# Patient Record
Sex: Female | Born: 1995 | Marital: Single | Smoking: Never smoker
Health system: Southern US, Community
[De-identification: ages and names within clinical notes are randomized; demographics above are authoritative.]

## PROBLEM LIST (undated history)

## (undated) HISTORY — PX: LEG SURGERY: SHX1003

## (undated) HISTORY — PX: WISDOM TOOTH EXTRACTION: SHX21

---

## 2017-08-11 ENCOUNTER — Other Ambulatory Visit (HOSPITAL_COMMUNITY)
Admission: RE | Admit: 2017-08-11 | Discharge: 2017-08-11 | Disposition: A | Discharge status: Home / Self Care | Payer: Self-pay | Source: Ambulatory Visit | Attending: Nurse Practitioner | Admitting: Nurse Practitioner

## 2017-08-11 ENCOUNTER — Encounter: Payer: Self-pay | Admitting: Nurse Practitioner

## 2017-08-11 ENCOUNTER — Ambulatory Visit: Payer: Self-pay | Admitting: Nurse Practitioner

## 2017-08-11 VITALS — BP 130/60 | HR 77 | Temp 98.3°F | Ht <= 58 in | Wt 86.0 lb

## 2017-08-11 DIAGNOSIS — A5901 Trichomonal vulvovaginitis: Secondary | ICD-10-CM

## 2017-08-11 DIAGNOSIS — N761 Subacute and chronic vaginitis: Secondary | ICD-10-CM | POA: Insufficient documentation

## 2017-08-11 MED ORDER — METRONIDAZOLE 500 MG PO TABS: 1000.0000 mg | ORAL_TABLET | Freq: Two times a day (BID) | ORAL | 0 refills | Status: DC

## 2017-08-11 MED ORDER — METRONIDAZOLE 500 MG PO TABS: 2000.0000 mg | ORAL_TABLET | Freq: Two times a day (BID) | ORAL | 0 refills | Status: DC

## 2017-08-11 NOTE — Progress Notes (Addendum)
Subjective:  Patient ID: Andrea Harrison, female    DOB: 1996/01/30  Age: 22 y.o. MRN: [id number]  CC: New Patient (Initial Visit) (address vaginal symptom) and Vaginal Itching (She is C/O vaginal itching that started 6-7 months ago.  She got it checked out once and they said they thought it was a yeast inf and it came back.  Sometimes has yellowish color to discharge with slight odor. The last time she was sexually active was 7-8 months ago and did not use protection every time.)  Vaginal Itching  The patient's primary symptoms include genital itching, a genital odor and vaginal discharge. The patient's pertinent negatives include no genital lesions, genital rash, missed menses, pelvic pain or vaginal bleeding. This is a new problem. The current episode started more than 1 month ago. The problem occurs constantly. The problem has been unchanged. The patient is experiencing no pain. She is not pregnant. Pertinent negatives include no abdominal pain, back pain, chills, dysuria, fever, flank pain, joint pain, painful intercourse, rash or urgency. The vaginal discharge was white, thin and mucopurulent. The vaginal bleeding is typical of menses. The symptoms are aggravated by intercourse. She has tried antifungals for the symptoms. The treatment provided no relief. Sexual activity: not sexually active in last 8months. It is unknown whether or not her partner has an STD. She uses nothing for contraception. Her menstrual history has been regular. Her past medical history is significant for vaginosis.    No outpatient medications prior to visit.   No facility-administered medications prior to visit.     ROS See HPI  Objective:  BP 130/60 (BP Location: Left Arm, Patient Position: Sitting, Cuff Size: Small)   Pulse 77   Temp 98.3 F (36.8 C) (Oral)   Ht 3' 10.26" (1.175 m)   Wt 86 lb (39 kg)   LMP 08/04/2017   SpO2 97%   BMI 28.25 kg/m   BP Readings from Last 3 Encounters:  08/11/17 130/60    Wt  Readings from Last 3 Encounters:  08/11/17 86 lb (39 kg)    Physical Exam  Constitutional: No distress.  Cardiovascular: Normal rate.  Pulmonary/Chest: Effort normal.  Genitourinary: Rectal exam shows no external hemorrhoid. Pelvic exam was performed with patient supine. No labial fusion. There is no rash on the right labia. There is no rash or tenderness on the left labia. Cervix exhibits discharge and friability. Cervix exhibits no motion tenderness. Right adnexum displays no mass, no tenderness and no fullness. Left adnexum displays no mass, no tenderness and no fullness. Vaginal discharge found.  Lymphadenopathy: No inguinal adenopathy noted on the right or left side.  Skin: Skin is warm and dry. No rash noted.  Psychiatric: She has a normal mood and affect. Her behavior is normal.  Vitals reviewed.  No results found for: WBC, HGB, HCT, PLT, GLUCOSE, CHOL, TRIG, HDL, LDLDIRECT, LDLCALC, ALT, AST, NA, K, CL, CREATININE, BUN, CO2, TSH, PSA, INR, GLUF, HGBA1C, MICROALBUR  Assessment & Plan:   Andrea Harrison was seen today for new patient (initial visit) and vaginal itching.  Diagnoses and all orders for this visit:  Subacute vaginitis -     Cervicovaginal ancillary only -     Discontinue: metroNIDAZOLE (FLAGYL) 500 MG tablet; Take 4 tablets (2,000 mg total) by mouth 2 (two) times daily. -     Discontinue: metroNIDAZOLE (FLAGYL) 500 MG tablet; Take 2 tablets (1,000 mg total) by mouth 2 (two) times daily. -     fluconazole (DIFLUCAN) 150 MG tablet; Take  1 tablet (150 mg total) by mouth daily. Take second tab 3days apart from first tab -     metroNIDAZOLE (FLAGYL) 500 MG tablet; Take 2 tablets (1,000 mg total) by mouth 2 (two) times daily.  Trichomoniasis of vagina -     metroNIDAZOLE (FLAGYL) 500 MG tablet; Take 2 tablets (1,000 mg total) by mouth 2 (two) times daily.   I am having Andrea Harrison start on fluconazole. I am also having her maintain her metroNIDAZOLE.  Meds ordered this encounter   Medications  . DISCONTD: metroNIDAZOLE (FLAGYL) 500 MG tablet    Sig: Take 4 tablets (2,000 mg total) by mouth 2 (two) times daily.    Dispense:  4 tablet    Refill:  0    Order Specific Question:   Supervising Provider    Answer:   Dianne Dun [[id number]]  . DISCONTD: metroNIDAZOLE (FLAGYL) 500 MG tablet    Sig: Take 2 tablets (1,000 mg total) by mouth 2 (two) times daily.    Dispense:  4 tablet    Refill:  0    Change in dose    Order Specific Question:   Supervising Provider    Answer:   Dianne Dun [[id number]]  . fluconazole (DIFLUCAN) 150 MG tablet    Sig: Take 1 tablet (150 mg total) by mouth daily. Take second tab 3days apart from first tab    Dispense:  2 tablet    Refill:  0    Order Specific Question:   Supervising Provider    Answer:   Dianne Dun [[id number]]  . metroNIDAZOLE (FLAGYL) 500 MG tablet    Sig: Take 2 tablets (1,000 mg total) by mouth 2 (two) times daily.    Dispense:  4 tablet    Refill:  0    Change in dose    Order Specific Question:   Supervising Provider    Answer:   Dianne Dun [[id number]]    Follow-up: Return in about 1 month (around 09/08/2017) for CPE (fasting).  Alysia Penna, NP

## 2017-08-11 NOTE — Patient Instructions (Signed)
You will be called with lab results  Vaginitis Vaginitis is an inflammation of the vagina. It can happen when the normal bacteria and yeast in the vagina grow too much. There are different types. Treatment will depend on the type you have. Follow these instructions at home:  Take all medicines as told by your doctor.  Keep your vagina area clean and dry. Avoid soap. Rinse the area with water.  Avoid washing and cleaning out the vagina (douching).  Do not use tampons or have sex (intercourse) until your treatment is done.  Wipe from front to back after going to the restroom.  Wear cotton underwear.  Avoid wearing underwear while you sleep until your vaginitis is gone.  Avoid tight pants. Avoid underwear or nylons without a cotton panel.  Take off wet clothing (such as a bathing suit) as soon as you can.  Use mild, unscented products. Avoid fabric softeners and scented: ? Feminine sprays. ? Laundry detergents. ? Tampons. ? Soaps or bubble baths.  Practice safe sex and use condoms. Get help right away if:  You have belly (abdominal) pain.  You have a fever or lasting symptoms for more than 2-3 days.  You have a fever and your symptoms suddenly get worse. This information is not intended to replace advice given to you by your health care provider. Make sure you discuss any questions you have with your health care provider. Document Released: 06/24/2008 Document Revised: 09/03/2015 Document Reviewed: 09/08/2011 Elsevier Interactive Patient Education  2017 [employer].

## 2017-08-15 LAB — CERVICOVAGINAL ANCILLARY ONLY
Bacterial vaginitis: POSITIVE — AB
Candida vaginitis: POSITIVE — AB
Chlamydia: NEGATIVE
HPV (WINDOPATH): DETECTED — AB
Neisseria Gonorrhea: NEGATIVE
TRICH (WINDOWPATH): POSITIVE — AB

## 2017-08-15 MED ORDER — METRONIDAZOLE 500 MG PO TABS: 1000.0000 mg | ORAL_TABLET | Freq: Two times a day (BID) | ORAL | 0 refills | Status: DC

## 2017-08-15 MED ORDER — FLUCONAZOLE 150 MG PO TABS: 150.0000 mg | ORAL_TABLET | Freq: Every day | ORAL | 0 refills | Status: DC

## 2017-08-15 NOTE — Addendum Note (Signed)
Addended by: Alysia Penna L on: 08/15/2017 11:10 PM   Modules accepted: Orders

## 2017-08-24 ENCOUNTER — Encounter: Payer: Self-pay | Admitting: Nurse Practitioner

## 2017-08-24 ENCOUNTER — Other Ambulatory Visit (HOSPITAL_COMMUNITY)
Admission: RE | Admit: 2017-08-24 | Discharge: 2017-08-24 | Disposition: A | Discharge status: Home / Self Care | Payer: Self-pay | Source: Ambulatory Visit | Attending: Nurse Practitioner | Admitting: Nurse Practitioner

## 2017-08-24 ENCOUNTER — Ambulatory Visit: Payer: Self-pay | Admitting: Nurse Practitioner

## 2017-08-24 VITALS — BP 148/86 | HR 63 | Temp 97.6°F | Ht <= 58 in | Wt 85.6 lb

## 2017-08-24 DIAGNOSIS — B9689 Other specified bacterial agents as the cause of diseases classified elsewhere: Secondary | ICD-10-CM

## 2017-08-24 DIAGNOSIS — A5901 Trichomonal vulvovaginitis: Secondary | ICD-10-CM

## 2017-08-24 DIAGNOSIS — Z0001 Encounter for general adult medical examination with abnormal findings: Secondary | ICD-10-CM

## 2017-08-24 DIAGNOSIS — Z114 Encounter for screening for human immunodeficiency virus [HIV]: Secondary | ICD-10-CM

## 2017-08-24 DIAGNOSIS — N760 Acute vaginitis: Secondary | ICD-10-CM

## 2017-08-24 DIAGNOSIS — Q774 Achondroplasia: Secondary | ICD-10-CM

## 2017-08-24 LAB — COMPREHENSIVE METABOLIC PANEL
ALBUMIN: 4.3 g/dL (ref 3.5–5.2)
ALT: 37 U/L — ABNORMAL HIGH (ref 0–35)
AST: 33 U/L (ref 0–37)
Alkaline Phosphatase: 46 U/L (ref 39–117)
BUN: 11 mg/dL (ref 6–23)
CHLORIDE: 103 meq/L (ref 96–112)
CO2: 28 mEq/L (ref 19–32)
CREATININE: 0.46 mg/dL (ref 0.40–1.20)
Calcium: 9.7 mg/dL (ref 8.4–10.5)
GFR: 183.65 mL/min (ref >60.00)
GLUCOSE: 97 mg/dL (ref 70–99)
Potassium: 4.8 mEq/L (ref 3.5–5.1)
SODIUM: 140 meq/L (ref 135–145)
Total Bilirubin: 0.6 mg/dL (ref 0.2–1.2)
Total Protein: 7.1 g/dL (ref 6.0–8.3)

## 2017-08-24 LAB — CBC
HCT: 38.5 % (ref 36.0–46.0)
Hemoglobin: 13 g/dL (ref 12.0–15.0)
MCHC: 33.7 g/dL (ref 30.0–36.0)
MCV: 94.2 fl (ref 78.0–100.0)
PLATELETS: 265 10*3/uL (ref 150.0–400.0)
RBC: 4.09 Mil/uL (ref 3.87–5.11)
RDW: 12.4 % (ref 11.5–14.6)
WBC: 4.7 10*3/uL (ref 4.5–10.5)

## 2017-08-24 LAB — TSH: TSH: 1.43 u[IU]/mL (ref 0.35–5.50)

## 2017-08-24 NOTE — Progress Notes (Signed)
Subjective:    Patient ID: Andrea Harrison, female    DOB: 1996-01-05, 22 y.o.   MRN: [id number]  Patient presents today for complete physical  HPI   last pcp with [facility] while in middle school.  Accompanied by mother.  Has questions about HPV and vaccine. Improved vaginal discharge, discharge is nor clear to white (normal for her). Resolved vaginal itching and odor. Not currently sexually active.  Immunizations: (TDAP, Hep C screen, Pneumovax, Influenza, zoster)  Health Maintenance  Topic Date Due  . HIV Screening  06/03/2012  . Flu Shot  11/09/2017  . Tetanus Vaccine  11/04/2018   Diet:regular.  Weight:  Wt Readings from Last 3 Encounters:  08/24/17 85 lb 9.6 oz (38.8 kg)  08/11/17 86 lb (39 kg)    Exercise:none.  Fall Risk: Fall Risk  08/11/2017  Falls in the past year? No   Home Safety:home with parents.  Depression/Suicide: Depression screen [facility] 2/9 08/11/2017  Decreased Interest 0  Down, Depressed, Hopeless 0  PHQ - 2 Score 0   Vision:needed, will schedule  Dental:up to date. Every 6months.  Medications and allergies reviewed with patient and updated if appropriate.  Patient Active Problem List   Diagnosis Date Noted  . 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 fluconazole (DIFLUCAN) 150 MG tablet Take 1 tablet (150 mg total) by mouth daily. Take second tab 3days apart from first tab (Patient not taking: Reported on 08/24/2017) 2 tablet 0  . metroNIDAZOLE (FLAGYL) 500 MG tablet Take 2 tablets (1,000 mg total) by mouth 2 (two) times daily. (Patient not taking: Reported on 08/24/2017) 4 tablet 0   No current facility-administered medications on file prior to visit.     History reviewed. No pertinent past medical history.  Past Surgical History:  Procedure Laterality Date  . WISDOM TOOTH EXTRACTION      Social History   Socioeconomic History  . Marital  status: Single    Spouse name: Not on file  . Number of children: Not on file  . Years of education: Not on file  . Highest education level: Not on file  Occupational History  . Occupation: Consulting civil engineer  Social Needs  . Financial resource strain: Not on file  . Food insecurity:    Worry: Not on file    Inability: Not on file  . Transportation needs:    Medical: Not on file    Non-medical: Not on file  Tobacco Use  . Smoking status: Never Smoker  . Smokeless tobacco: Never Used  Substance and Sexual Activity  . Alcohol use: Yes    Comment: Occas  . Drug use: Never  . Sexual activity: Not Currently    Partners: Male  Lifestyle  . Physical activity:    Days per week: Not on file    Minutes per session: Not on file  . Stress: Not on file  Relationships  . Social connections:    Talks on phone: Not on file    Gets together: Not on file    Attends religious service: Not on file    Active member of club or organization: Not on file    Attends meetings of clubs or organizations: Not on file    Relationship status: Not on file  Other Topics Concern  . Not on file  Social History Narrative  . Not on file    History reviewed. No pertinent family history.  Review of Systems  Constitutional: Negative for fever, malaise/fatigue and weight loss.  HENT: Negative for congestion and sore throat.   Eyes:       Negative for visual changes  Respiratory: Negative for cough and shortness of breath.   Cardiovascular: Negative for chest pain, palpitations and leg swelling.  Gastrointestinal: Negative for blood in stool, constipation, diarrhea and heartburn.  Genitourinary: Negative for dysuria, frequency and urgency.  Musculoskeletal: Negative for falls, joint pain and myalgias.  Skin: Negative for rash.  Neurological: Negative for dizziness, sensory change and headaches.  Endo/Heme/Allergies: Does not bruise/bleed easily.  Psychiatric/Behavioral: Negative for depression, substance  abuse and suicidal ideas. The patient is not nervous/anxious.     Objective:   Vitals:   08/24/17 1104  BP: (!) 148/86  Pulse: 63  Temp: 97.6 F (36.4 C)  SpO2: 98%    Body mass index is 28.12 kg/m.   Physical Examination:  Physical Exam  Constitutional: She is oriented to person, place, and time. She appears well-nourished. No distress.  HENT:  Right Ear: External ear normal.  Left Ear: External ear normal.  Nose: Nose normal.  Mouth/Throat: Oropharynx is clear and moist.  Eyes: Pupils are equal, round, and reactive to light. Conjunctivae and EOM are normal.  Neck: Normal range of motion. Neck supple. No thyromegaly present.  Cardiovascular: Normal rate, regular rhythm, normal heart sounds and intact distal pulses.  Pulmonary/Chest: Effort normal and breath sounds normal. Right breast exhibits no inverted nipple, no mass, no nipple discharge, no skin change and no tenderness. Left breast exhibits no inverted nipple, no mass, no nipple discharge, no skin change and no tenderness. Breasts are symmetrical.  Abdominal: Soft. Bowel sounds are normal. There is no tenderness. Hernia confirmed negative in the right inguinal area and confirmed negative in the left inguinal area.  Genitourinary: Vagina normal and uterus normal. Pelvic exam was performed with patient supine. There is no rash or tenderness on the right labia. There is no rash or tenderness on the left labia. Cervix exhibits discharge. Cervix exhibits no friability. Right adnexum displays no tenderness and no fullness. Left adnexum displays no tenderness and no fullness.  Genitourinary Comments: Thin white vaginal discharge, no odor  Musculoskeletal: She exhibits no tenderness.  Lymphadenopathy:    She has no cervical adenopathy. No inguinal adenopathy noted on the right or left side.  Neurological: She is alert and oriented to person, place, and time.  Skin: Skin is warm and dry. No rash noted.  Psychiatric: She has a  normal mood and affect. Her behavior is normal. Thought content normal.  Vitals reviewed.   ASSESSMENT and PLAN:  [name] was seen today for annual exam.  Diagnoses and all orders for this visit:  Encounter for preventative adult health care exam with abnormal findings -     CBC -     TSH -     Comprehensive metabolic panel -     HIV antibody  Trichomoniasis of vagina -     Cervicovaginal ancillary only  Encounter for screening for HIV -     HIV antibody  BV (bacterial vaginosis) -     metroNIDAZOLE (METROGEL) 0.75 % vaginal gel; Place 1 Applicatorful vaginally at bedtime.  Achondroplasia syndrome   I spent explaining to Ms. Smucker and her mother the different HPV virus in relation to cervical cancer and genital wart. We also talked about HPV vaccine, importance of safe sex practices and/or abstinence. Ms. Dumm decided to think about HPV vaccine and  get back to me.  No problem-specific Assessment & Plan notes found for this encounter.     Follow up: Return if symptoms worsen or fail to improve.  Alysia Penna, NP

## 2017-08-24 NOTE — Patient Instructions (Addendum)
Call office for nurse visit if you decide to get HPV vaccine  Preventive Care for Andrea Harrison, Female The transition to life after high school as a young adult can be a stressful time with many changes. You may start seeing a primary care physician instead of a pediatrician. This is the time when your health care becomes your responsibility. Preventive care refers to lifestyle choices and visits with your health care provider that can promote health and wellness. What does preventive care include?  A yearly physical exam. This is also called an annual wellness visit.  Dental exams once or twice a year.  Routine eye exams. Ask your health care provider how often you should have your eyes checked.  Personal lifestyle choices, including: ? Daily care of your teeth and gums. ? Regular physical activity. ? Eating a healthy diet. ? Avoiding tobacco and drug use. ? Avoiding or limiting alcohol use. ? Practicing safe sex. ? Taking vitamin and mineral supplements as recommended by your health care provider. What happens during an annual wellness visit? Preventive care starts with a yearly visit to your primary care physician. The services and screenings done by your health care provider during your annual wellness visit will depend on your overall health, lifestyle risk factors, and family history of disease. Counseling Your health care provider may ask you questions about:  Past medical problems and your family's medical history.  Medicines or supplements you take.  Health insurance and access to health care.  Alcohol, tobacco, and drug use.  Your safety at home, work, or school.  Access to firearms.  Emotional well-being and how you cope with stress.  Relationship well-being.  Diet, exercise, and sleep habits.  Your sexual health and activity.  Your methods of birth control.  Your menstrual cycle.  Your pregnancy history.  Screening You may have the following tests or  measurements:  Height, weight, and BMI.  Blood pressure.  Lipid and cholesterol levels.  Tuberculosis skin test.  Skin exam.  Vision and hearing tests.  Screening test for hepatitis.  Screening tests for sexually transmitted diseases (STDs), if you are at risk.  BRCA-related cancer screening. This may be done if you have a family history of breast, ovarian, tubal, or peritoneal cancers.  Pelvic exam and Pap test. This may be done every 3 years starting at age 3.  Vaccines Your health care provider may recommend certain vaccines, such as:  Influenza vaccine. This is recommended every year.  Tetanus, diphtheria, and acellular pertussis (Tdap, Td) vaccine. You may need a Td booster every 10 years.  Varicella vaccine. You may need this if you have not been vaccinated.  HPV vaccine. If you are 61 or younger, you may need three doses over 6 months.  Measles, mumps, and rubella (MMR) vaccine. You may need at least one dose of MMR. You may also need a second dose.  Pneumococcal 13-valent conjugate (PCV13) vaccine. You may need this if you have certain conditions and were not previously vaccinated.  Pneumococcal polysaccharide (PPSV23) vaccine. You may need one or two doses if you smoke cigarettes or if you have certain conditions.  Meningococcal vaccine. One dose is recommended if you are age 106-21 years and a first-year college student living in a residence hall, or if you have one of several medical conditions. You may also need additional booster doses.  Hepatitis A vaccine. You may need this if you have certain conditions or if you travel or work in places where you may be  exposed to hepatitis A.  Hepatitis B vaccine. You may need this if you have certain conditions or if you travel or work in places where you may be exposed to hepatitis B.  Haemophilus influenzae type b (Hib) vaccine. You may need this if you have certain risk factors.  Talk to your health care provider  about which screenings and vaccines you need and how often you need them. What steps can I take to develop healthy behaviors?  Have regular preventive health care visits with your primary care physician and dentist.  Eat a healthy diet.  Drink enough fluid to keep your urine clear or pale yellow.  Stay active. Exercise at least 30 minutes 5 or more days of the week.  Use alcohol responsibly.  Maintain a healthy weight.  Do not use any products that contain nicotine, such as cigarettes, chewing tobacco, and e-cigarettes. If you need help quitting, ask your health care provider.  Do not use drugs.  Practice safe sex.  Use birth control (contraception) to prevent unwanted pregnancy. If you plan to become pregnant, see your health care provider for a pre-conception visit.  Find healthy ways to manage stress. How can I protect myself from injury? Injuries from violence or accidents are the leading cause of death among young adults and can often be prevented. Take these steps to help protect yourself:  Always wear your seat belt while driving or riding in a vehicle.  Do not drive if you have been drinking alcohol. Do not ride with someone who has been drinking.  Do not drive when you are tired or distracted. Do not text while driving.  Wear a helmet and other protective equipment during sports activities.  If you have firearms in your house, make sure you follow all gun safety procedures.  Seek help if you have been bullied, physically abused, or sexually abused.  Use the Internet responsibly to avoid dangers such as online bullying and online sexual predators.  What can I do to cope with stress? Young adults may face many new challenges that can be stressful, such as finding a job, going to college, moving away from home, managing money, being in a relationship, getting married, and having children. To manage stress:  Avoid known stressful situations when you can.  Exercise  regularly.  Find a stress-reducing activity that works best for you. Examples include meditation, yoga, listening to music, or reading.  Spend time in nature.  Keep a journal to write about your stress and how you respond.  Talk to your health care provider about stress. He or she may suggest counseling.  Spend time with supportive friends or family.  Do not cope with stress by: ? Drinking alcohol or using drugs. ? Smoking cigarettes. ? Eating.  Where can I get more information? Learn more about preventive care and healthy habits from:  [place] and Gynecologists: KaraokeExchange.nl  U.S. Probation officer Task Force: StageSync.si  National Adolescent and [place]: StrategicRoad.nl  American Academy of Pediatrics Bright Futures: https://brightfutures.MemberVerification.co.za  Society for Adolescent Health and Medicine: MoralBlog.co.za.aspx  PodExchange.nl: ToyLending.fr  This information is not intended to replace advice given to you by your health care provider. Make sure you discuss any questions you have with your health care provider. Document Released: 08/13/2015 Document Revised: 09/03/2015 Document Reviewed: 08/13/2015 Elsevier Interactive Patient Education  2018 [place].   Human Papillomavirus Human papillomavirus (HPV) is the most common sexually transmitted infection (STI). It easily spreads from person  to person (is highly contagious). HPV infections cause genital warts. Certain types of HPV may cause cancers, including cancer of the lower part of the uterus (cervix), vagina, outer female genital area (vulva), penis, anus, and rectum. HPV may also cause cancers of the oral cavity, such as the throat,  tongue, and tonsils. There are many types of HPV. It usually does not cause symptoms. However, sometimes there are wart-like lesions in the throat or warts in the genital area that you can see or feel. It is possible to be infected for long periods and pass HPV to others without knowing it. What are the causes? HPV is caused by a virus that spreads from person to person through sexual contact. This includes oral, vaginal, or anal sex. What increases the risk? The following factors may make you more likely to develop this condition:  Having unprotected oral, vaginal, or anal sex.  Having several sex partners.  Having a sex partner who has other sex partners.  Having or having had another STI.  Having a weak disease-fighting (immune) system.  Having damaged skin in the genital area.  What are the signs or symptoms? Most people who have HPV do not have any symptoms. If symptoms are present, they may include:  Wartlike lesions in the throat (from having oral sex).  Warts on the infected skin or mucous membranes.  Genital warts that may itch, burn, bleed, or be painful during sexual intercourse.  How is this diagnosed? If wartlike lesions are present in the throat or if genital warts are present, your health care provider can usually diagnose HPV with a physical exam. Genital warts are easily seen. In females, tests may be used to diagnose HPV, including:  A Pap test. A Pap test takes a sample of cells from your cervix to check for cancer and HPV infection.  An HPV test. This is similar to a Pap test and involves taking a sample of cells from your cervix.  Using a scope to view the cervix (colposcopy). This may be done if a pelvic exam or Pap test is abnormal. A sample of tissue may be removed for testing (biopsy) during the colposcopy.  Currently, there is no test to detect HPV in males. How is this treated? There is no treatment for the virus itself. However, there are treatments  for the health problems and symptoms HPV can cause. Your health care provider will monitor you closely after you are treated as HPV can come back and may need treatment again. Treatment for HPV may include:  Medicines, which may be injected or applied to genital warts in a cream, lotion, liquid or gel form.  Use of a probe to apply extreme cold (cryotherapy) to the genital warts.  Application of an intense beam of light (laser treatment) on the genital warts.  Use of a probe to apply extreme heat (electrocautery) on the genital warts.  Surgery to remove the genital warts.  Follow these instructions at home: Medicines  Take over-the-counter and prescription medicines only as told by your health care provider. This include creams for itching or irritation.  Do not treat genital warts with medicines used for treating hand warts. General instructions  Do not touch or scratch the warts.  Do not have sex while you are being treated.  Do not douche or use tampons during treatment (women).  Tell your sex partner about your infection. He or she may also need to be treated.  If you become pregnant, tell your health  care provider that you have HPV. Your health care provider will monitor you closely during pregnancy to make sure your baby is safe.  Keep all follow-up visits as told by your health care provider. This is important. How is this prevented?  Talk with your health care provider about getting the HPV vaccines. These vaccines prevent some HPV infections and cancers. The vaccines are recommended for males and females between the ages of 81 and 51. They will not work if you already have HPV, and they are not recommended for pregnant women.  After treatment, use condoms during sex to prevent future infections.  Have only one sex partner.  Have a sex partner who does not have other sex partners.  Get regular Pap tests as directed by your health care provider. Contact a health care  provider if:  The treated skin becomes red, swollen, or painful.  You have a fever.  You feel generally ill.  You feel lumps or pimples sticking out in and around your genital area.  You develop bleeding of the vagina or the treatment area.  You have painful sexual intercourse. Summary  Human papillomavirus (HPV) is the most common sexually transmitted infection (STI) and is highly contagious.  Most people carrying HPV do not have any symptoms.  HPV can be prevented with vaccination. The vaccine is recommended for males and females between the ages of 33 and 65.  There is no treatment for the virus itself. However, there are treatments for the health problems and symptoms HPV can cause. This information is not intended to replace advice given to you by your health care provider. Make sure you discuss any questions you have with your health care provider. Document Released: 06/18/2003 Document Revised: 03/06/2016 Document Reviewed: 03/06/2016 Elsevier Interactive Patient Education  2018 [employer].  Normal lab results Wet prep indicates persistent BV, resolved trichomoniasis. Sent metronidazole gel for BV. pending HIV.

## 2017-08-25 ENCOUNTER — Encounter: Payer: Self-pay | Admitting: Nurse Practitioner

## 2017-08-25 DIAGNOSIS — Q774 Achondroplasia: Secondary | ICD-10-CM | POA: Insufficient documentation

## 2017-08-25 LAB — CERVICOVAGINAL ANCILLARY ONLY
Bacterial vaginitis: POSITIVE — AB
Candida vaginitis: NEGATIVE
TRICH (WINDOWPATH): NEGATIVE

## 2017-08-25 LAB — HIV ANTIBODY (ROUTINE TESTING W REFLEX): HIV 1&2 Ab, 4th Generation: NONREACTIVE

## 2017-08-25 MED ORDER — METRONIDAZOLE 0.75 % VA GEL: 1.0000 | Freq: Every day | VAGINAL | 0 refills | Status: DC

## 2017-08-28 ENCOUNTER — Telehealth: Payer: Self-pay | Admitting: Nurse Practitioner

## 2017-08-28 NOTE — Telephone Encounter (Signed)
Copied from CRM [id number]. Topic: Quick Communication - See Telephone Encounter >> Aug 28, 2017 11:59 AM Arlyss Gandy, NT wrote: CRM for notification. See Telephone encounter for: 08/28/17. Pt would like a call back to discuss lab results.

## 2017-10-09 ENCOUNTER — Telehealth: Payer: Self-pay | Admitting: Family

## 2017-10-09 DIAGNOSIS — J069 Acute upper respiratory infection, unspecified: Secondary | ICD-10-CM

## 2017-10-09 DIAGNOSIS — B9789 Other viral agents as the cause of diseases classified elsewhere: Secondary | ICD-10-CM

## 2017-10-09 MED ORDER — BENZONATATE 100 MG PO CAPS: 100.0000 mg | ORAL_CAPSULE | Freq: Three times a day (TID) | ORAL | 0 refills | Status: DC | PRN

## 2017-10-09 MED ORDER — FLUTICASONE PROPIONATE 50 MCG/ACT NA SUSP
2.0000 | Freq: Every day | NASAL | 6 refills | Status: DC
Start: 2017-10-09 — End: 2017-12-14

## 2017-10-09 NOTE — Progress Notes (Signed)

## 2017-11-26 ENCOUNTER — Other Ambulatory Visit: Payer: Self-pay | Admitting: Nurse Practitioner

## 2017-11-26 DIAGNOSIS — B9689 Other specified bacterial agents as the cause of diseases classified elsewhere: Secondary | ICD-10-CM

## 2017-11-26 DIAGNOSIS — A5901 Trichomonal vulvovaginitis: Secondary | ICD-10-CM

## 2017-11-26 DIAGNOSIS — N760 Acute vaginitis: Secondary | ICD-10-CM

## 2017-11-26 DIAGNOSIS — N761 Subacute and chronic vaginitis: Secondary | ICD-10-CM

## 2017-11-27 MED ORDER — METRONIDAZOLE 0.75 % VA GEL: 1.0000 | Freq: Every day | VAGINAL | 0 refills | Status: DC

## 2017-11-27 NOTE — Telephone Encounter (Signed)
Ok to send in the gel form but denied tablet per [place].

## 2017-12-13 ENCOUNTER — Encounter: Payer: Self-pay | Admitting: Nurse Practitioner

## 2017-12-14 ENCOUNTER — Encounter: Payer: Self-pay | Admitting: Nurse Practitioner

## 2017-12-14 ENCOUNTER — Other Ambulatory Visit (HOSPITAL_COMMUNITY)
Admission: RE | Admit: 2017-12-14 | Discharge: 2017-12-14 | Disposition: A | Discharge status: Home / Self Care | Payer: Self-pay | Source: Ambulatory Visit | Attending: Nurse Practitioner | Admitting: Nurse Practitioner

## 2017-12-14 ENCOUNTER — Ambulatory Visit: Payer: Self-pay | Admitting: Nurse Practitioner

## 2017-12-14 VITALS — BP 120/64 | HR 69 | Temp 97.8°F | Ht <= 58 in | Wt 85.0 lb

## 2017-12-14 DIAGNOSIS — Z7251 High risk heterosexual behavior: Secondary | ICD-10-CM

## 2017-12-14 DIAGNOSIS — N898 Other specified noninflammatory disorders of vagina: Secondary | ICD-10-CM | POA: Insufficient documentation

## 2017-12-14 LAB — POCT URINE PREGNANCY: Preg Test, Ur: NEGATIVE

## 2017-12-14 NOTE — Patient Instructions (Signed)
You will be contacted with lab results  Urine pregnancy is negative.  Please maintain abstinence or use condoms at all times.

## 2017-12-14 NOTE — Progress Notes (Signed)
Subjective:  Patient ID: Andrea Harrison, female    DOB: Jul 30, 1995  Age: 22 y.o. MRN: [id number]  CC: Vaginal Itching (patient is complaining of vaginal itchy,this has been going on for 1 wk. she took metrogel 11/27/17 for this symptoms but it cane back. report of unprotected sex 1 mo ago.  )  Vaginal Itching  The patient's primary symptoms include genital itching. The patient's pertinent negatives include no genital lesions, genital odor, genital rash, missed menses, pelvic pain, vaginal bleeding or vaginal discharge. This is a new problem. The current episode started in the past 7 days. The problem occurs constantly. The problem has been unchanged. The patient is experiencing no pain. She is not pregnant. Pertinent negatives include no abdominal pain, anorexia, dysuria, frequency, hematuria, painful intercourse, rash or urgency. The symptoms are aggravated by intercourse. Treatments tried: metrogel. The treatment provided no relief. She is sexually active. It is unknown whether or not her partner has an STD. She uses nothing for contraception. Her menstrual history has been regular. Her past medical history is significant for an STD and vaginosis.   Reviewed past Medical, Social and Family history today.  Outpatient Medications Prior to Visit  Medication Sig Dispense Refill  . benzonatate (TESSALON PERLES) 100 MG capsule Take 1 capsule (100 mg total) by mouth 3 (three) times daily as needed. (Patient not taking: Reported on 12/14/2017) 20 capsule 0  . fluconazole (DIFLUCAN) 150 MG tablet Take 1 tablet (150 mg total) by mouth daily. Take second tab 3days apart from first tab (Patient not taking: Reported on 08/24/2017) 2 tablet 0  . fluticasone (FLONASE) 50 MCG/ACT nasal spray Place 2 sprays into both nostrils daily. (Patient not taking: Reported on 12/14/2017) 16 g 6  . metroNIDAZOLE (FLAGYL) 500 MG tablet Take 2 tablets (1,000 mg total) by mouth 2 (two) times daily. (Patient not taking: Reported on  08/24/2017) 4 tablet 0  . metroNIDAZOLE (METROGEL) 0.75 % vaginal gel Place 1 Applicatorful vaginally at bedtime. (Patient not taking: Reported on 12/14/2017) 70 g 0   No facility-administered medications prior to visit.     ROS See HPI  Objective:  BP 120/64   Pulse 69   Temp 97.8 F (36.6 C) (Oral)   Ht 3' 10.26" (1.175 m)   Wt 85 lb (38.6 kg)   LMP 11/12/2017   SpO2 100%   BMI 27.93 kg/m   BP Readings from Last 3 Encounters:  12/14/17 120/64  08/24/17 (!) 148/86  08/11/17 130/60    Wt Readings from Last 3 Encounters:  12/14/17 85 lb (38.6 kg)  08/24/17 85 lb 9.6 oz (38.8 kg)  08/11/17 86 lb (39 kg)    Physical Exam  Genitourinary: Pelvic exam was performed with patient supine. There is no rash or tenderness on the right labia. There is no rash or tenderness on the left labia. Cervix exhibits discharge. Cervix exhibits no motion tenderness and no friability. Right adnexum displays no tenderness. Left adnexum displays no tenderness. No erythema or tenderness in the vagina. Vaginal discharge found.  Genitourinary Comments: Chaperone present  Vitals reviewed.   Lab Results  Component Value Date   WBC 4.7 08/24/2017   HGB 13.0 08/24/2017   HCT 38.5 08/24/2017   PLT 265.0 08/24/2017   GLUCOSE 97 08/24/2017   ALT 37 (H) 08/24/2017   AST 33 08/24/2017   NA 140 08/24/2017   K 4.8 08/24/2017   CL 103 08/24/2017   CREATININE 0.46 08/24/2017   BUN 11 08/24/2017   CO2 28  08/24/2017   TSH 1.43 08/24/2017     Assessment & Plan:   Atlas was seen today for vaginal itching.  Diagnoses and all orders for this visit:  Vaginal itching -     Cervicovaginal ancillary only  Unprotected sexual intercourse -     POCT urine pregnancy   I have discontinued Analya Graveline's fluconazole, metroNIDAZOLE, benzonatate, fluticasone, and metroNIDAZOLE.  No orders of the defined types were placed in this encounter.   Follow-up: No follow-ups on file.  Alysia Penna, NP

## 2017-12-18 ENCOUNTER — Encounter: Payer: Self-pay | Admitting: Nurse Practitioner

## 2017-12-18 LAB — CERVICOVAGINAL ANCILLARY ONLY
BACTERIAL VAGINITIS: NEGATIVE
CANDIDA VAGINITIS: NEGATIVE
Chlamydia: NEGATIVE
Neisseria Gonorrhea: NEGATIVE
TRICH (WINDOWPATH): NEGATIVE

## 2018-07-25 ENCOUNTER — Encounter: Payer: Self-pay | Admitting: Nurse Practitioner

## 2018-10-29 ENCOUNTER — Encounter: Payer: Self-pay | Admitting: Nurse Practitioner

## 2018-10-31 ENCOUNTER — Ambulatory Visit: Payer: Self-pay | Admitting: Nurse Practitioner

## 2018-10-31 ENCOUNTER — Other Ambulatory Visit (HOSPITAL_COMMUNITY)
Admission: RE | Admit: 2018-10-31 | Discharge: 2018-10-31 | Disposition: A | Discharge status: Home / Self Care | Payer: Self-pay | Source: Ambulatory Visit | Attending: Nurse Practitioner | Admitting: Nurse Practitioner

## 2018-10-31 ENCOUNTER — Encounter: Payer: Self-pay | Admitting: Nurse Practitioner

## 2018-10-31 VITALS — BP 90/60 | HR 72 | Temp 98.3°F | Ht <= 58 in | Wt 86.4 lb

## 2018-10-31 DIAGNOSIS — Z124 Encounter for screening for malignant neoplasm of cervix: Secondary | ICD-10-CM

## 2018-10-31 DIAGNOSIS — Z113 Encounter for screening for infections with a predominantly sexual mode of transmission: Secondary | ICD-10-CM

## 2018-10-31 DIAGNOSIS — L0292 Furuncle, unspecified: Secondary | ICD-10-CM

## 2018-10-31 MED ORDER — MUPIROCIN 2 % EX OINT: 1.0000 "application " | TOPICAL_OINTMENT | Freq: Two times a day (BID) | CUTANEOUS | 0 refills | Status: DC

## 2018-10-31 MED ORDER — MUPIROCIN 2 % EX OINT: 1.0000 "application " | TOPICAL_OINTMENT | Freq: Two times a day (BID) | CUTANEOUS | 0 refills | Status: AC

## 2018-10-31 NOTE — Progress Notes (Signed)
Subjective:  Patient ID: Andrea Harrison, female    DOB: 01-19-1996  Age: 23 y.o. MRN: [id number]  CC: Gynecologic Exam (pap smear/ pt states she has 2 lesions on outer vagina their itchy, kind of hard when you rub finger across and grew in size/sex in feb unprotected doesn't want screening wants you to look first.)  Gynecologic Exam The patient's primary symptoms include genital lesions. The patient's pertinent negatives include no genital itching, genital odor, genital rash, missed menses, pelvic pain, vaginal bleeding or vaginal discharge. This is a new problem. The current episode started more than 1 month ago. The problem occurs constantly. The problem has been waxing and waning. The patient is experiencing no pain. The problem affects the left side. She is not pregnant. Pertinent negatives include no abdominal pain, chills, discolored urine, dysuria, fever, flank pain, frequency, joint pain, nausea, painful intercourse, rash or urgency. Nothing aggravates the symptoms. She has tried nothing for the symptoms. She is sexually active. It is unknown whether or not her partner has an STD. She uses nothing for contraception. Her menstrual history has been regular. Her past medical history is significant for an STD and vaginosis. There is no history of PID.   Reviewed past Medical, Social and Family history today.  No outpatient medications prior to visit.   No facility-administered medications prior to visit.     ROS See HPI  Objective:  BP 90/60   Pulse 72   Temp 98.3 F (36.8 C) (Oral)   Ht 3' 10.26" (1.175 m)   Wt 86 lb 6.4 oz (39.2 kg)   LMP 10/01/2018 (Exact Date)   SpO2 99%   BMI 28.39 kg/m   BP Readings from Last 3 Encounters:  10/31/18 90/60  12/14/17 120/64  08/24/17 (!) 148/86    Wt Readings from Last 3 Encounters:  10/31/18 86 lb 6.4 oz (39.2 kg)  12/14/17 85 lb (38.6 kg)  08/24/17 85 lb 9.6 oz (38.8 kg)    Physical Exam Vitals signs reviewed. Exam conducted with a  chaperone present.  Cardiovascular:     Rate and Rhythm: Normal rate.     Pulses: Normal pulses.  Genitourinary:    Labia:        Right: No rash, tenderness or lesion.        Left: Lesion present. No rash or tenderness.      Vagina: No foreign body. Vaginal discharge present. No erythema, tenderness, bleeding or lesions.     Cervix: Normal.     Adnexa: Right adnexa normal and left adnexa normal.    Lymphadenopathy:     Lower Body: No right inguinal adenopathy. No left inguinal adenopathy.  Neurological:     Mental Status: She is alert.    Lab Results  Component Value Date   WBC 4.7 08/24/2017   HGB 13.0 08/24/2017   HCT 38.5 08/24/2017   PLT 265.0 08/24/2017   GLUCOSE 97 08/24/2017   ALT 37 (H) 08/24/2017   AST 33 08/24/2017   NA 140 08/24/2017   K 4.8 08/24/2017   CL 103 08/24/2017   CREATININE 0.46 08/24/2017   BUN 11 08/24/2017   CO2 28 08/24/2017   TSH 1.43 08/24/2017    Assessment & Plan:   Andrea Harrison was seen today for gynecologic exam.  Diagnoses and all orders for this visit:  Furuncle -     Discontinue: mupirocin ointment (BACTROBAN) 2 %; Place 1 application into the nose 2 (two) times daily for 4 days. Apply with dressing change -  mupirocin ointment (BACTROBAN) 2 %; Apply 1 application topically 2 (two) times daily for 4 days.  Screening examination for STD (sexually transmitted disease) -     Cervicovaginal ancillary only( [place])  Encounter for Papanicolaou smear for cervical cancer screening -     Cytology - PAP( [place])   I have changed Andrea Klett "Molly Stys"'s mupirocin ointment.  Meds ordered this encounter  Medications  . DISCONTD: mupirocin ointment (BACTROBAN) 2 %    Sig: Place 1 application into the nose 2 (two) times daily for 4 days. Apply with dressing change    Dispense:  15 g    Refill:  0    Order Specific Question:   Supervising Provider    Answer:   Lucille Passy [[id number]]  . mupirocin ointment (BACTROBAN) 2 %    Sig:  Apply 1 application topically 2 (two) times daily for 4 days.    Dispense:  15 g    Refill:  0    Order Specific Question:   Supervising Provider    Answer:   Lucille Passy [[id number]]    Problem List Items Addressed This Visit    None    Visit Diagnoses    Furuncle    -  Primary   Relevant Medications   mupirocin ointment (BACTROBAN) 2 %   Screening examination for STD (sexually transmitted disease)       Relevant Orders   Cervicovaginal ancillary only( [place])   Encounter for Papanicolaou smear for cervical cancer screening       Relevant Orders   Cytology - PAP( [place])       Follow-up: No follow-ups on file.  Wilfred Lacy, NP

## 2018-10-31 NOTE — Patient Instructions (Addendum)
Use warm compress BID, 22mins at a time. Call office if not improvement in 2weeks.  You will be contacted with results

## 2018-11-03 LAB — CERVICOVAGINAL ANCILLARY ONLY
Bacterial vaginitis: NEGATIVE
Candida vaginitis: NEGATIVE
Chlamydia: NEGATIVE
Neisseria Gonorrhea: NEGATIVE
Trichomonas: NEGATIVE

## 2018-11-05 LAB — CYTOLOGY - PAP: Diagnosis: NEGATIVE

## 2019-08-27 ENCOUNTER — Encounter: Payer: Self-pay | Admitting: Nurse Practitioner

## 2019-09-11 ENCOUNTER — Ambulatory Visit: Payer: Self-pay | Admitting: Nurse Practitioner

## 2019-09-30 ENCOUNTER — Encounter: Payer: Self-pay | Admitting: Nurse Practitioner

## 2019-09-30 ENCOUNTER — Other Ambulatory Visit: Payer: Self-pay

## 2019-09-30 ENCOUNTER — Other Ambulatory Visit (HOSPITAL_COMMUNITY)
Admission: RE | Admit: 2019-09-30 | Discharge: 2019-09-30 | Disposition: A | Discharge status: Home / Self Care | Payer: Self-pay | Source: Ambulatory Visit | Attending: Nurse Practitioner | Admitting: Nurse Practitioner

## 2019-09-30 ENCOUNTER — Ambulatory Visit: Payer: Self-pay | Admitting: Nurse Practitioner

## 2019-09-30 VITALS — BP 110/62 | HR 97 | Temp 97.4°F | Ht <= 58 in | Wt 88.6 lb

## 2019-09-30 DIAGNOSIS — Z13220 Encounter for screening for lipoid disorders: Secondary | ICD-10-CM

## 2019-09-30 DIAGNOSIS — Z113 Encounter for screening for infections with a predominantly sexual mode of transmission: Secondary | ICD-10-CM | POA: Insufficient documentation

## 2019-09-30 DIAGNOSIS — L209 Atopic dermatitis, unspecified: Secondary | ICD-10-CM | POA: Insufficient documentation

## 2019-09-30 DIAGNOSIS — Z0000 Encounter for general adult medical examination without abnormal findings: Secondary | ICD-10-CM

## 2019-09-30 DIAGNOSIS — L83 Acanthosis nigricans: Secondary | ICD-10-CM

## 2019-09-30 DIAGNOSIS — Z136 Encounter for screening for cardiovascular disorders: Secondary | ICD-10-CM

## 2019-09-30 DIAGNOSIS — N760 Acute vaginitis: Secondary | ICD-10-CM

## 2019-09-30 DIAGNOSIS — B9689 Other specified bacterial agents as the cause of diseases classified elsewhere: Secondary | ICD-10-CM

## 2019-09-30 LAB — HEMOGLOBIN A1C: Hgb A1c MFr Bld: 5.4 % (ref 4.6–6.5)

## 2019-09-30 LAB — COMPREHENSIVE METABOLIC PANEL
ALT: 11 U/L (ref 0–35)
AST: 17 U/L (ref 0–37)
Albumin: 4.5 g/dL (ref 3.5–5.2)
Alkaline Phosphatase: 58 U/L (ref 39–117)
BUN: 9 mg/dL (ref 6–23)
CO2: 28 mEq/L (ref 19–32)
Calcium: 9.6 mg/dL (ref 8.4–10.5)
Chloride: 101 mEq/L (ref 96–112)
Creatinine, Ser: 0.43 mg/dL (ref 0.40–1.20)
GFR: 183.07 mL/min (ref >60.00)
Glucose, Bld: 92 mg/dL (ref 70–99)
Potassium: 4.4 mEq/L (ref 3.5–5.1)
Sodium: 137 mEq/L (ref 135–145)
Total Bilirubin: 0.7 mg/dL (ref 0.2–1.2)
Total Protein: 7.2 g/dL (ref 6.0–8.3)

## 2019-09-30 LAB — LIPID PANEL
Cholesterol: 182 mg/dL (ref 0–200)
HDL: 86.9 mg/dL (ref >39.00)
LDL Cholesterol: 82 mg/dL (ref 0–99)
NonHDL: 95.01
Total CHOL/HDL Ratio: 2
Triglycerides: 64 mg/dL (ref 0.0–149.0)
VLDL: 12.8 mg/dL (ref 0.0–40.0)

## 2019-09-30 LAB — CBC
HCT: 40.1 % (ref 36.0–46.0)
Hemoglobin: 13.6 g/dL (ref 12.0–15.0)
MCHC: 33.9 g/dL (ref 30.0–36.0)
MCV: 93 fl (ref 78.0–100.0)
Platelets: 269 10*3/uL (ref 150.0–400.0)
RBC: 4.31 Mil/uL (ref 3.87–5.11)
RDW: 12.7 % (ref 11.5–15.5)
WBC: 4.7 10*3/uL (ref 4.0–10.5)

## 2019-09-30 LAB — TSH: TSH: 2.46 u[IU]/mL (ref 0.35–4.50)

## 2019-09-30 NOTE — Patient Instructions (Signed)
Go to lab for blood draw  You will be called with results.   Health Maintenance, Female Adopting a healthy lifestyle and getting preventive care are important in promoting health and wellness. Ask your health care provider about:  The right schedule for you to have regular tests and exams.  Things you can do on your own to prevent diseases and keep yourself healthy. What should I know about diet, weight, and exercise? Eat a healthy diet   Eat a diet that includes plenty of vegetables, fruits, low-fat dairy products, and lean protein.  Do not eat a lot of foods that are high in solid fats, added sugars, or sodium. Maintain a healthy weight Body mass index (BMI) is used to identify weight problems. It estimates body fat based on height and weight. Your health care provider can help determine your BMI and help you achieve or maintain a healthy weight. Get regular exercise Get regular exercise. This is one of the most important things you can do for your health. Most adults should:  Exercise for at least 150 minutes each week. The exercise should increase your heart rate and make you sweat (moderate-intensity exercise).  Do strengthening exercises at least twice a week. This is in addition to the moderate-intensity exercise.  Spend less time sitting. Even light physical activity can be beneficial. Watch cholesterol and blood lipids Have your blood tested for lipids and cholesterol at 22 years of age, then have this test every 5 years. Have your cholesterol levels checked more often if:  Your lipid or cholesterol levels are high.  You are older than 22 years of age.  You are at high risk for heart disease. What should I know about cancer screening? Depending on your health history and family history, you may need to have cancer screening at various ages. This may include screening for:  Breast cancer.  Cervical cancer.  Colorectal cancer.  Skin cancer.  Lung cancer. What  should I know about heart disease, diabetes, and high blood pressure? Blood pressure and heart disease  High blood pressure causes heart disease and increases the risk of stroke. This is more likely to develop in people who have high blood pressure readings, are of African descent, or are overweight.  Have your blood pressure checked: ? Every 3-5 years if you are 89-62 years of age. ? Every year if you are 65 years old or older. Diabetes Have regular diabetes screenings. This checks your fasting blood sugar level. Have the screening done:  Once every three years after age 42 if you are at a normal weight and have a low risk for diabetes.  More often and at a younger age if you are overweight or have a high risk for diabetes. What should I know about preventing infection? Hepatitis B If you have a higher risk for hepatitis B, you should be screened for this virus. Talk with your health care provider to find out if you are at risk for hepatitis B infection. Hepatitis C Testing is recommended for:  Everyone born from [birth year] through 1965.  Anyone with known risk factors for hepatitis C. Sexually transmitted infections (STIs)  Get screened for STIs, including gonorrhea and chlamydia, if: ? You are sexually active and are younger than 22 years of age. ? You are older than 22 years of age and your health care provider tells you that you are at risk for this type of infection. ? Your sexual activity has changed since you were last screened, and  you are at increased risk for chlamydia or gonorrhea. Ask your health care provider if you are at risk.  Ask your health care provider about whether you are at high risk for HIV. Your health care provider may recommend a prescription medicine to help prevent HIV infection. If you choose to take medicine to prevent HIV, you should first get tested for HIV. You should then be tested every 3 months for as long as you are taking the medicine. Pregnancy  If  you are about to stop having your period (premenopausal) and you may become pregnant, seek counseling before you get pregnant.  Take 400 to 800 micrograms (mcg) of folic acid every day if you become pregnant.  Ask for birth control (contraception) if you want to prevent pregnancy. Osteoporosis and menopause Osteoporosis is a disease in which the bones lose minerals and strength with aging. This can result in bone fractures. If you are 89 years old or older, or if you are at risk for osteoporosis and fractures, ask your health care provider if you should:  Be screened for bone loss.  Take a calcium or vitamin D supplement to lower your risk of fractures.  Be given hormone replacement therapy (HRT) to treat symptoms of menopause. Follow these instructions at home: Lifestyle  Do not use any products that contain nicotine or tobacco, such as cigarettes, e-cigarettes, and chewing tobacco. If you need help quitting, ask your health care provider.  Do not use street drugs.  Do not share needles.  Ask your health care provider for help if you need support or information about quitting drugs. Alcohol use  Do not drink alcohol if: ? Your health care provider tells you not to drink. ? You are pregnant, may be pregnant, or are planning to become pregnant.  If you drink alcohol: ? Limit how much you use to 0-1 drink a day. ? Limit intake if you are breastfeeding.  Be aware of how much alcohol is in your drink. In the [place] one drink equals one 12 oz bottle of beer (355 mL), one 5 oz glass of wine (148 mL), or one 1 oz glass of hard liquor (44 mL). General instructions  Schedule regular health, dental, and eye exams.  Stay current with your vaccines.  Tell your health care provider if: ? You often feel depressed. ? You have ever been abused or do not feel safe at home. Summary  Adopting a healthy lifestyle and getting preventive care are important in promoting health and  wellness.  Follow your health care provider's instructions about healthy diet, exercising, and getting tested or screened for diseases.  Follow your health care provider's instructions on monitoring your cholesterol and blood pressure. This information is not intended to replace advice given to you by your health care provider. Make sure you discuss any questions you have with your health care provider. Document Revised: 03/21/2018 Document Reviewed: 03/21/2018 Elsevier Patient Education  2020 Elsevier Inc.  Acanthosis Nigricans Acanthosis nigricans is a condition in which dark, velvety markings appear on the skin. What are the causes? This condition may be caused by:  A hormonal or glandular disorder, such as diabetes.  Obesity.  Certain medicines, such as birth control pills.  A tumor. This is rare. Some people inherit the condition from their parents. What increases the risk? You are more likely to develop this condition if you:  Have a hormonal or glandular disorder.  Are overweight.  Take certain medicines.  Have certain cancers, especially stomach  cancer.  Have dark-colored skin (dark complexion). What are the signs or symptoms? The main symptom of this condition is velvety markings on the skin that are light brown, black, or grayish in color.  The markings usually appear on the face. They may also appear in skin fold areas at the neck, armpits, inner thighs, and groin.  In severe cases, markings may also appear on the lips, hands, breasts, eyelids, and mouth. How is this diagnosed? This condition may be diagnosed based on your symptoms.  A skin sample may be removed for testing (skin biopsy).  You may also have tests to help determine the cause of the condition. How is this treated? Treatment for this condition depends on the cause. Treatment may involve reducing insulin levels, which are often high in people who have this condition. Insulin levels can be  reduced with:  Dietary changes, such as avoiding starchy foods and sugars.  Losing weight.  Medicines. Sometimes, treatment involves:  Medicines to improve the appearance of the skin.  Laser treatment to improve the appearance of the skin.  Surgical removal of the skin markings (dermabrasion). Follow these instructions at home:  Follow diet instructions from your health care provider.  Lose weight if you are overweight.  Take over-the-counter and prescription medicines only as told by your health care provider.  Keep all follow-up visits as told by your health care provider. This is important. Contact a health care provider if:  New skin markings develop on a part of the body where they rarely develop, such as on your lips, hands, breasts, eyelids, or mouth.  The condition recurs for an unknown reason. Summary  Acanthosis nigricans is a condition in which dark, velvety markings appear on the skin.  Treatment for this condition depends on the cause. Treatment may include dietary changes, medicines, laser treatment, or surgery.  Take over-the-counter and prescription medicines only as told by your health care provider.  Contact a health care provider if new skin markings develop on a part of the body where they rarely develop, such as on your lips, hands, breasts, eyelids, or mouth.  Keep all follow-up visits as told by your health care provider. This is important. This information is not intended to replace advice given to you by your health care provider. Make sure you discuss any questions you have with your health care provider. Document Revised: 08/07/2017 Document Reviewed: 08/07/2017 Elsevier Patient Education  [place].

## 2019-09-30 NOTE — Progress Notes (Signed)
Subjective:    Patient ID: Andrea Harrison, female    DOB: 1995/12/26, 24 y.o.   MRN: [id number]  Patient presents today for complete physical and STD screen  HPI  Sexual History (orientation,birth control, marital status, STD):single, sexually active, no condom use, LMP 09/11/2019, regular cycles  Depression/Suicide: Depression screen [facility] 2/9 09/30/2019 08/11/2017  Decreased Interest 0 0  Down, Depressed, Hopeless 0 0  PHQ - 2 Score 0 0   Vision:up to date, use of corrective lens  Dental:no dental insurance  Immunizations: (TDAP, Hep C screen, Pneumovax, Influenza, zoster)  Health Maintenance  Topic Date Due  .  Hepatitis C: One time screening is recommended by Center for Disease Control  (CDC) for  adults born from [birth year] through 1965.   Never done  . Tetanus Vaccine  09/29/2020*  . Flu Shot  11/10/2019  . Pap Smear  10/30/2021  . Pap Smear  10/30/2021  . HIV Screening  Completed  *Topic was postponed. The date shown is not the original due date.   Diet:regular.  Weight:  Wt Readings from Last 3 Encounters:  09/30/19 88 lb 9.6 oz (40.2 kg)  10/31/18 86 lb 6.4 oz (39.2 kg)  12/14/17 85 lb (38.6 kg)    Exercise:none  Fall Risk: Fall Risk  09/30/2019 08/11/2017  Falls in the past year? 0 No  Number falls in past yr: 0 -  Injury with Fall? 0 -   Medications and allergies reviewed with patient and updated if appropriate.  Patient Active Problem List   Diagnosis Date Noted  . Acanthosis nigricans 09/30/2019  . 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   No current facility-administered medications on file prior to visit.    History reviewed. No pertinent past medical history.  Past Surgical History:  Procedure Laterality Date  . WISDOM TOOTH EXTRACTION      Social History   Socioeconomic History  . Marital status: Single    Spouse name: Not on file  . Number of children: Not on file  . Years of education:  Not on file  . Highest education level: Not on file  Occupational History  . Occupation: Ship broker  Tobacco Use  . Smoking status: Never Smoker  . Smokeless tobacco: Never Used  Vaping Use  . Vaping Use: Never used  Substance and Sexual Activity  . Alcohol use: Yes    Comment: Occas  . Drug use: Never  . Sexual activity: Yes    Partners: Male  Other Topics Concern  . Not on file  Social History Narrative  . Not on file   Social Determinants of Health   Financial Resource Strain:   . Difficulty of Paying Living Expenses:   Food Insecurity:   . Worried About Charity fundraiser in the Last Year:   . Arboriculturist in the Last Year:   Transportation Needs:   . Film/video editor (Medical):   Marland Kitchen Lack of Transportation (Non-Medical):   Physical Activity:   . Days of Exercise per Week:   . Minutes of Exercise per Session:   Stress:   . Feeling of Stress :   Social Connections:   . Frequency of Communication with Friends and Family:   . Frequency of Social Gatherings with Friends and Family:   . Attends Religious Services:   . Active Member of Clubs or Organizations:   . Attends Archivist Meetings:   Marland Kitchen Marital Status:  History reviewed. No pertinent family history.      Review of Systems  Constitutional: Negative for fever, malaise/fatigue and weight loss.  HENT: Negative for congestion and sore throat.   Eyes:       Negative for visual changes  Respiratory: Negative for cough and shortness of breath.   Cardiovascular: Negative for chest pain, palpitations and leg swelling.  Gastrointestinal: Negative for blood in stool, constipation, diarrhea and heartburn.  Genitourinary: Negative for dysuria, frequency and urgency.  Musculoskeletal: Negative for falls, joint pain and myalgias.  Skin: Negative for rash.  Neurological: Negative for dizziness, sensory change and headaches.  Endo/Heme/Allergies: Does not bruise/bleed easily.    Psychiatric/Behavioral: Negative for depression, substance abuse and suicidal ideas. The patient is not nervous/anxious.     Objective:   Vitals:   09/30/19 1015  BP: 110/62  Pulse: 97  Temp: (!) 97.4 F (36.3 C)  SpO2: 97%    Body mass index is 27.04 kg/m.   Physical Examination:  Physical Exam Vitals reviewed. Exam conducted with a chaperone present.  Constitutional:      General: She is not in acute distress.    Appearance: She is obese.  HENT:     Right Ear: Tympanic membrane, ear canal and external ear normal.     Left Ear: Tympanic membrane, ear canal and external ear normal.  Eyes:     General: No scleral icterus.    Extraocular Movements: Extraocular movements intact.     Conjunctiva/sclera: Conjunctivae normal.  Neck:     Thyroid: No thyromegaly.  Cardiovascular:     Rate and Rhythm: Normal rate.     Heart sounds: Normal heart sounds.  Pulmonary:     Effort: Pulmonary effort is normal.     Breath sounds: Normal breath sounds.  Chest:     Chest wall: No tenderness.     Breasts:        Right: Normal.        Left: Normal.  Abdominal:     General: Bowel sounds are normal. There is no distension.     Palpations: Abdomen is soft.     Tenderness: There is no abdominal tenderness.  Genitourinary:    General: Normal vulva.     Labia:        Right: No rash or tenderness.        Left: No rash or tenderness.      Vagina: Vaginal discharge present. No erythema or tenderness.     Cervix: Discharge present. No cervical motion tenderness, friability or erythema.     Uterus: Normal.      Adnexa: Right adnexa normal and left adnexa normal.  Musculoskeletal:        General: Normal range of motion.     Cervical back: Normal range of motion and neck supple.     Right lower leg: No edema.     Left lower leg: No edema.  Lymphadenopathy:     Cervical: No cervical adenopathy.     Upper Body:     Right upper body: No supraclavicular, axillary or pectoral adenopathy.      Left upper body: No supraclavicular, axillary or pectoral adenopathy.     Lower Body: No right inguinal adenopathy. No left inguinal adenopathy.  Skin:    General: Skin is warm and dry.  Neurological:     Mental Status: She is alert and oriented to person, place, and time.  Psychiatric:        Mood and Affect: Mood normal.  Behavior: Behavior normal.        Thought Content: Thought content normal.    ASSESSMENT and PLAN: This visit occurred during the SARS-CoV-2 public health emergency.  Safety protocols were in place, including screening questions prior to the visit, additional usage of staff PPE, and extensive cleaning of exam room while observing appropriate contact time as indicated for disinfecting solutions.   Kioni was seen today for annual exam.  Diagnoses and all orders for this visit:  Preventative health care -     CBC -     Comprehensive metabolic panel -     Lipid panel -     TSH  Encounter for lipid screening for cardiovascular disease -     Lipid panel  Acanthosis nigricans -     Hemoglobin A1c  Screen for STD (sexually transmitted disease) -     Hepatitis C Antibody -     Cervicovaginal ancillary only( [place]) -     HIV Antibody (routine testing w rflx)   No problem-specific Assessment & Plan notes found for this encounter.     Problem List Items Addressed This Visit      Musculoskeletal and Integument   Acanthosis nigricans   Relevant Orders   Hemoglobin A1c    Other Visit Diagnoses    Preventative health care    -  Primary   Relevant Orders   CBC   Comprehensive metabolic panel   Lipid panel   TSH   Encounter for lipid screening for cardiovascular disease       Relevant Orders   Lipid panel   Screen for STD (sexually transmitted disease)       Relevant Orders   Hepatitis C Antibody   Cervicovaginal ancillary only( [place])   HIV Antibody (routine testing w rflx)       Follow up: Return in about 1 year (around  09/29/2020) for CPE (fasting).  Alysia Penna, NP

## 2019-10-01 LAB — HIV ANTIBODY (ROUTINE TESTING W REFLEX): HIV 1&2 Ab, 4th Generation: NONREACTIVE

## 2019-10-01 LAB — HEPATITIS C ANTIBODY
Hepatitis C Ab: NONREACTIVE
SIGNAL TO CUT-OFF: 0.02 (ref <1.00)

## 2019-10-02 LAB — CERVICOVAGINAL ANCILLARY ONLY
Bacterial Vaginitis (gardnerella): POSITIVE — AB
Candida Glabrata: NEGATIVE
Candida Vaginitis: NEGATIVE
Chlamydia: NEGATIVE
Comment: NEGATIVE
Comment: NEGATIVE
Comment: NEGATIVE
Comment: NEGATIVE
Comment: NEGATIVE
Comment: NORMAL
Neisseria Gonorrhea: NEGATIVE
Trichomonas: NEGATIVE

## 2019-10-02 MED ORDER — METRONIDAZOLE 0.75 % VA GEL: 1.0000 | Freq: Every day | VAGINAL | 0 refills | Status: DC

## 2019-10-02 NOTE — Addendum Note (Signed)
Addended by: Michaela Corner on: 10/02/2019 01:34 PM   Modules accepted: Orders

## 2019-11-08 ENCOUNTER — Ambulatory Visit: Payer: Self-pay | Admitting: Nurse Practitioner

## 2020-04-06 ENCOUNTER — Telehealth: Payer: Self-pay

## 2020-04-06 NOTE — Telephone Encounter (Signed)
Pt calling to request a Tb skin screening.  Ok to schedule pt for Tuesday and Thursday?

## 2020-04-08 NOTE — Telephone Encounter (Signed)
ok 

## 2020-04-09 NOTE — Telephone Encounter (Signed)
I spoke with and she informed me that she had it performed already.

## 2020-04-13 ENCOUNTER — Other Ambulatory Visit: Payer: Self-pay

## 2020-04-14 ENCOUNTER — Ambulatory Visit: Payer: Self-pay

## 2020-04-14 DIAGNOSIS — Z111 Encounter for screening for respiratory tuberculosis: Secondary | ICD-10-CM

## 2020-04-15 ENCOUNTER — Other Ambulatory Visit: Payer: Self-pay

## 2020-04-16 ENCOUNTER — Ambulatory Visit (INDEPENDENT_AMBULATORY_CARE_PROVIDER_SITE_OTHER): Payer: Self-pay

## 2020-04-16 DIAGNOSIS — Z111 Encounter for screening for respiratory tuberculosis: Secondary | ICD-10-CM

## 2020-04-16 LAB — TB SKIN TEST
Induration: 0 mm
TB Skin Test: NEGATIVE

## 2020-04-16 NOTE — Progress Notes (Signed)
Per orders of Alysia Penna, NP  injection of Tuberculin skin test given by Kelse Ploch L As[place]ion Tapscott, applied to Right ventral forearm. Patient tolerated injection well. No signs or symptoms of a reaction were noted prior to patient leaving the nurse visit. Patient will make appointment for 2 days to return to have it read.   Explained how to read the test, measuring induration not just erythema; she will come into office if test appears positive.  P:  PPD placed on 04/14/20.  Patient advised to return for reading within 48-72 hours.

## 2020-04-16 NOTE — Patient Instructions (Signed)
Health Maintenance Due  Topic Date Due  . INFLUENZA VACCINE  Never done    Depression screen [facility] 2/9 09/30/2019 08/11/2017  Decreased Interest 0 0  Down, Depressed, Hopeless 0 0  PHQ - 2 Score 0 0

## 2020-04-16 NOTE — Progress Notes (Signed)
PPD Reading Note PPD read and results entered in EpicCare. Result: 0 mm induration.  negative

## 2020-05-08 ENCOUNTER — Telehealth: Payer: Self-pay | Admitting: Nurse Practitioner

## 2020-05-08 ENCOUNTER — Encounter: Payer: Self-pay | Admitting: Nurse Practitioner

## 2020-05-08 DIAGNOSIS — J069 Acute upper respiratory infection, unspecified: Secondary | ICD-10-CM | POA: Insufficient documentation

## 2020-05-08 MED ORDER — BENZONATATE 100 MG PO CAPS: 100.0000 mg | ORAL_CAPSULE | Freq: Two times a day (BID) | ORAL | 0 refills | Status: DC | PRN

## 2020-05-08 MED ORDER — AZITHROMYCIN 250 MG PO TABS: ORAL_TABLET | ORAL | 0 refills | Status: DC

## 2020-05-08 NOTE — Assessment & Plan Note (Signed)
-  symptoms x 9 days; negative COVID test -Rx. z-pack -Rx. Tessalon -she has OTC meds that are helping with symptoms -we discussed flonase if her ear pain persists -we also discussed saline nasal rinse to help with nasal dryness/congestion

## 2020-05-08 NOTE — Progress Notes (Signed)
Acute Office Visit  Subjective:    Patient ID: Andrea Harrison, female    DOB: Apr 03, 1996, 25 y.o.   MRN: [id number]  Chief Complaint  Patient presents with  . Sinusitis    -symptoms started 04/29/20; 9 days ago    HPI Patient is in today for sick visit.  She has been taking advil cold and sinus, and that dried up her postnasal drip and throat.  She tested negative for COVID. Symptoms per ROS.  No past medical history on file.  Past Surgical History:  Procedure Laterality Date  . WISDOM TOOTH EXTRACTION      No family history on file.  Social History   Socioeconomic History  . Marital status: Single    Spouse name: Not on file  . Number of children: Not on file  . Years of education: Not on file  . Highest education level: Not on file  Occupational History  . Occupation: Consulting civil engineer  Tobacco Use  . Smoking status: Never Smoker  . Smokeless tobacco: Never Used  Vaping Use  . Vaping Use: Never used  Substance and Sexual Activity  . Alcohol use: Yes    Comment: Occas  . Drug use: Never  . Sexual activity: Yes    Partners: Male  Other Topics Concern  . Not on file  Social History Narrative  . Not on file   Social Determinants of Health   Financial Resource Strain: Not on file  Food Insecurity: Not on file  Transportation Needs: Not on file  Physical Activity: Not on file  Stress: Not on file  Social Connections: Not on file  Intimate Partner Violence: Not on file    Outpatient Medications Prior to Visit  Medication Sig Dispense Refill  . metroNIDAZOLE (METROGEL VAGINAL) 0.75 % vaginal gel Place 1 Applicatorful vaginally at bedtime. 70 g 0   No facility-administered medications prior to visit.    No Known Allergies  Review of Systems  Constitutional: Positive for chills and fatigue. Negative for fever.  HENT: Positive for congestion, ear pain, postnasal drip, rhinorrhea, sinus pressure, sinus pain and sore throat.        Right ear pain  Respiratory:  Positive for cough. Negative for chest tightness, shortness of breath and wheezing.   Cardiovascular: Negative.   Neurological: Positive for headaches.       Objective:    Physical Exam  There were no vitals taken for this visit. Wt Readings from Last 3 Encounters:  09/30/19 88 lb 9.6 oz (40.2 kg)  10/31/18 86 lb 6.4 oz (39.2 kg)  12/14/17 85 lb (38.6 kg)    Health Maintenance Due  Topic Date Due  . INFLUENZA VACCINE  Never done    There are no preventive care reminders to display for this patient.   Lab Results  Component Value Date   TSH 2.46 09/30/2019   Lab Results  Component Value Date   WBC 4.7 09/30/2019   HGB 13.6 09/30/2019   HCT 40.1 09/30/2019   MCV 93.0 09/30/2019   PLT 269.0 09/30/2019   Lab Results  Component Value Date   NA 137 09/30/2019   K 4.4 09/30/2019   CO2 28 09/30/2019   GLUCOSE 92 09/30/2019   BUN 9 09/30/2019   CREATININE 0.43 09/30/2019   BILITOT 0.7 09/30/2019   ALKPHOS 58 09/30/2019   AST 17 09/30/2019   ALT 11 09/30/2019   PROT 7.2 09/30/2019   ALBUMIN 4.5 09/30/2019   CALCIUM 9.6 09/30/2019   GFR 183.07 09/30/2019  Lab Results  Component Value Date   CHOL 182 09/30/2019   Lab Results  Component Value Date   HDL 86.90 09/30/2019   Lab Results  Component Value Date   LDLCALC 82 09/30/2019   Lab Results  Component Value Date   TRIG 64.0 09/30/2019   Lab Results  Component Value Date   CHOLHDL 2 09/30/2019   Lab Results  Component Value Date   HGBA1C 5.4 09/30/2019       Assessment & Plan:   Problem List Items Addressed This Visit      Respiratory   URI (upper respiratory infection)    -symptoms x 9 days; negative COVID test -Rx. z-pack -Rx. Tessalon -she has OTC meds that are helping with symptoms -we discussed flonase if her ear pain persists -we also discussed saline nasal rinse to help with nasal dryness/congestion      Relevant Medications   azithromycin (ZITHROMAX) 250 MG tablet        Meds ordered this encounter  Medications  . benzonatate (TESSALON) 100 MG capsule    Sig: Take 1 capsule (100 mg total) by mouth 2 (two) times daily as needed for cough.    Dispense:  20 capsule    Refill:  0  . azithromycin (ZITHROMAX) 250 MG tablet    Sig: Take as directed    Dispense:  6 tablet    Refill:  0    Please dispense as a z-pack   Date:  05/08/2020   Location of Patient: Home Location of Provider: Office Consent was obtain for visit to be over via telehealth. I verified that I am speaking with the correct person using two identifiers.  I connected with  Andrea Harrison on 05/08/20 via telephone and verified that I am speaking with the correct person using two identifiers.   I discussed the limitations of evaluation and management by telemedicine. The patient expressed understanding and agreed to proceed.  Time spent: 9 min   Heather Roberts, NP

## 2020-07-03 DIAGNOSIS — Z23 Encounter for immunization: Secondary | ICD-10-CM | POA: Diagnosis not present

## 2020-08-04 DIAGNOSIS — Z03818 Encounter for observation for suspected exposure to other biological agents ruled out: Secondary | ICD-10-CM | POA: Diagnosis not present

## 2020-08-04 DIAGNOSIS — Z20822 Contact with and (suspected) exposure to covid-19: Secondary | ICD-10-CM | POA: Diagnosis not present

## 2020-11-04 ENCOUNTER — Telehealth: Payer: Self-pay | Admitting: Physician Assistant

## 2020-11-04 DIAGNOSIS — J029 Acute pharyngitis, unspecified: Secondary | ICD-10-CM

## 2020-11-05 MED ORDER — AMOXICILLIN 500 MG PO TABS: 500.0000 mg | ORAL_TABLET | Freq: Two times a day (BID) | ORAL | 0 refills | Status: DC

## 2020-11-05 NOTE — Progress Notes (Signed)

## 2020-11-05 NOTE — Progress Notes (Signed)
I have spent 5 minutes in review of e-visit questionnaire, review and updating patient chart, medical decision making and response to patient.   Maeryn Mcgath Cody Jizel Cheeks, PA-C    

## 2020-11-13 DIAGNOSIS — U071 COVID-19: Secondary | ICD-10-CM | POA: Diagnosis not present

## 2020-11-13 DIAGNOSIS — Z01818 Encounter for other preprocedural examination: Secondary | ICD-10-CM | POA: Diagnosis not present

## 2020-11-13 DIAGNOSIS — Q774 Achondroplasia: Secondary | ICD-10-CM | POA: Diagnosis not present

## 2020-11-13 DIAGNOSIS — Z01812 Encounter for preprocedural laboratory examination: Secondary | ICD-10-CM | POA: Diagnosis not present

## 2020-12-17 DIAGNOSIS — Q774 Achondroplasia: Secondary | ICD-10-CM | POA: Diagnosis not present

## 2020-12-17 DIAGNOSIS — Z01818 Encounter for other preprocedural examination: Secondary | ICD-10-CM | POA: Diagnosis not present

## 2020-12-17 DIAGNOSIS — Z01812 Encounter for preprocedural laboratory examination: Secondary | ICD-10-CM | POA: Diagnosis not present

## 2020-12-31 DIAGNOSIS — M21171 Varus deformity, not elsewhere classified, right ankle: Secondary | ICD-10-CM | POA: Diagnosis not present

## 2020-12-31 DIAGNOSIS — M21162 Varus deformity, not elsewhere classified, left knee: Secondary | ICD-10-CM | POA: Diagnosis not present

## 2020-12-31 DIAGNOSIS — M21161 Varus deformity, not elsewhere classified, right knee: Secondary | ICD-10-CM | POA: Diagnosis not present

## 2020-12-31 DIAGNOSIS — Q774 Achondroplasia: Secondary | ICD-10-CM | POA: Diagnosis not present

## 2020-12-31 DIAGNOSIS — M21172 Varus deformity, not elsewhere classified, left ankle: Secondary | ICD-10-CM | POA: Diagnosis not present

## 2021-01-01 DIAGNOSIS — M21161 Varus deformity, not elsewhere classified, right knee: Secondary | ICD-10-CM | POA: Diagnosis not present

## 2021-01-01 DIAGNOSIS — M21171 Varus deformity, not elsewhere classified, right ankle: Secondary | ICD-10-CM | POA: Diagnosis not present

## 2021-01-01 DIAGNOSIS — M21162 Varus deformity, not elsewhere classified, left knee: Secondary | ICD-10-CM | POA: Diagnosis not present

## 2021-01-01 DIAGNOSIS — M21172 Varus deformity, not elsewhere classified, left ankle: Secondary | ICD-10-CM | POA: Diagnosis not present

## 2021-01-01 DIAGNOSIS — Q774 Achondroplasia: Secondary | ICD-10-CM | POA: Diagnosis not present

## 2021-01-05 DIAGNOSIS — M25651 Stiffness of right hip, not elsewhere classified: Secondary | ICD-10-CM | POA: Diagnosis not present

## 2021-01-05 DIAGNOSIS — M25661 Stiffness of right knee, not elsewhere classified: Secondary | ICD-10-CM | POA: Diagnosis not present

## 2021-01-05 DIAGNOSIS — M6281 Muscle weakness (generalized): Secondary | ICD-10-CM | POA: Diagnosis not present

## 2021-01-05 DIAGNOSIS — R269 Unspecified abnormalities of gait and mobility: Secondary | ICD-10-CM | POA: Diagnosis not present

## 2021-01-05 DIAGNOSIS — M25671 Stiffness of right ankle, not elsewhere classified: Secondary | ICD-10-CM | POA: Diagnosis not present

## 2021-01-06 DIAGNOSIS — M25651 Stiffness of right hip, not elsewhere classified: Secondary | ICD-10-CM | POA: Diagnosis not present

## 2021-01-06 DIAGNOSIS — R269 Unspecified abnormalities of gait and mobility: Secondary | ICD-10-CM | POA: Diagnosis not present

## 2021-01-06 DIAGNOSIS — M25661 Stiffness of right knee, not elsewhere classified: Secondary | ICD-10-CM | POA: Diagnosis not present

## 2021-01-06 DIAGNOSIS — M6281 Muscle weakness (generalized): Secondary | ICD-10-CM | POA: Diagnosis not present

## 2021-01-06 DIAGNOSIS — M25671 Stiffness of right ankle, not elsewhere classified: Secondary | ICD-10-CM | POA: Diagnosis not present

## 2021-01-08 DIAGNOSIS — M25651 Stiffness of right hip, not elsewhere classified: Secondary | ICD-10-CM | POA: Diagnosis not present

## 2021-01-08 DIAGNOSIS — M6281 Muscle weakness (generalized): Secondary | ICD-10-CM | POA: Diagnosis not present

## 2021-01-08 DIAGNOSIS — R269 Unspecified abnormalities of gait and mobility: Secondary | ICD-10-CM | POA: Diagnosis not present

## 2021-01-08 DIAGNOSIS — M25661 Stiffness of right knee, not elsewhere classified: Secondary | ICD-10-CM | POA: Diagnosis not present

## 2021-01-08 DIAGNOSIS — M25671 Stiffness of right ankle, not elsewhere classified: Secondary | ICD-10-CM | POA: Diagnosis not present

## 2021-01-11 DIAGNOSIS — M25671 Stiffness of right ankle, not elsewhere classified: Secondary | ICD-10-CM | POA: Diagnosis not present

## 2021-01-11 DIAGNOSIS — M25651 Stiffness of right hip, not elsewhere classified: Secondary | ICD-10-CM | POA: Diagnosis not present

## 2021-01-11 DIAGNOSIS — M6281 Muscle weakness (generalized): Secondary | ICD-10-CM | POA: Diagnosis not present

## 2021-01-11 DIAGNOSIS — M25661 Stiffness of right knee, not elsewhere classified: Secondary | ICD-10-CM | POA: Diagnosis not present

## 2021-01-11 DIAGNOSIS — R269 Unspecified abnormalities of gait and mobility: Secondary | ICD-10-CM | POA: Diagnosis not present

## 2021-01-11 DIAGNOSIS — Z4789 Encounter for other orthopedic aftercare: Secondary | ICD-10-CM | POA: Diagnosis not present

## 2021-01-14 DIAGNOSIS — M21172 Varus deformity, not elsewhere classified, left ankle: Secondary | ICD-10-CM | POA: Diagnosis not present

## 2021-01-14 DIAGNOSIS — Q774 Achondroplasia: Secondary | ICD-10-CM | POA: Diagnosis not present

## 2021-01-14 DIAGNOSIS — M92512 Juvenile osteochondrosis of proximal tibia, left leg: Secondary | ICD-10-CM | POA: Diagnosis not present

## 2021-01-14 DIAGNOSIS — Z9889 Other specified postprocedural states: Secondary | ICD-10-CM | POA: Diagnosis not present

## 2021-01-14 DIAGNOSIS — M21162 Varus deformity, not elsewhere classified, left knee: Secondary | ICD-10-CM | POA: Diagnosis not present

## 2021-01-15 DIAGNOSIS — Q774 Achondroplasia: Secondary | ICD-10-CM | POA: Diagnosis not present

## 2021-01-15 DIAGNOSIS — M92512 Juvenile osteochondrosis of proximal tibia, left leg: Secondary | ICD-10-CM | POA: Diagnosis not present

## 2021-01-18 DIAGNOSIS — M25651 Stiffness of right hip, not elsewhere classified: Secondary | ICD-10-CM | POA: Diagnosis not present

## 2021-01-18 DIAGNOSIS — M25671 Stiffness of right ankle, not elsewhere classified: Secondary | ICD-10-CM | POA: Diagnosis not present

## 2021-01-18 DIAGNOSIS — R269 Unspecified abnormalities of gait and mobility: Secondary | ICD-10-CM | POA: Diagnosis not present

## 2021-01-18 DIAGNOSIS — M25661 Stiffness of right knee, not elsewhere classified: Secondary | ICD-10-CM | POA: Diagnosis not present

## 2021-01-18 DIAGNOSIS — M6281 Muscle weakness (generalized): Secondary | ICD-10-CM | POA: Diagnosis not present

## 2021-01-20 DIAGNOSIS — M25671 Stiffness of right ankle, not elsewhere classified: Secondary | ICD-10-CM | POA: Diagnosis not present

## 2021-01-20 DIAGNOSIS — R269 Unspecified abnormalities of gait and mobility: Secondary | ICD-10-CM | POA: Diagnosis not present

## 2021-01-20 DIAGNOSIS — M25661 Stiffness of right knee, not elsewhere classified: Secondary | ICD-10-CM | POA: Diagnosis not present

## 2021-01-20 DIAGNOSIS — M25651 Stiffness of right hip, not elsewhere classified: Secondary | ICD-10-CM | POA: Diagnosis not present

## 2021-01-20 DIAGNOSIS — M6281 Muscle weakness (generalized): Secondary | ICD-10-CM | POA: Diagnosis not present

## 2021-01-22 DIAGNOSIS — M25661 Stiffness of right knee, not elsewhere classified: Secondary | ICD-10-CM | POA: Diagnosis not present

## 2021-01-22 DIAGNOSIS — M25671 Stiffness of right ankle, not elsewhere classified: Secondary | ICD-10-CM | POA: Diagnosis not present

## 2021-01-22 DIAGNOSIS — M25651 Stiffness of right hip, not elsewhere classified: Secondary | ICD-10-CM | POA: Diagnosis not present

## 2021-01-22 DIAGNOSIS — M6281 Muscle weakness (generalized): Secondary | ICD-10-CM | POA: Diagnosis not present

## 2021-01-22 DIAGNOSIS — R269 Unspecified abnormalities of gait and mobility: Secondary | ICD-10-CM | POA: Diagnosis not present

## 2021-01-25 DIAGNOSIS — M25671 Stiffness of right ankle, not elsewhere classified: Secondary | ICD-10-CM | POA: Diagnosis not present

## 2021-01-25 DIAGNOSIS — M25661 Stiffness of right knee, not elsewhere classified: Secondary | ICD-10-CM | POA: Diagnosis not present

## 2021-01-25 DIAGNOSIS — M6281 Muscle weakness (generalized): Secondary | ICD-10-CM | POA: Diagnosis not present

## 2021-01-25 DIAGNOSIS — M25651 Stiffness of right hip, not elsewhere classified: Secondary | ICD-10-CM | POA: Diagnosis not present

## 2021-01-25 DIAGNOSIS — R269 Unspecified abnormalities of gait and mobility: Secondary | ICD-10-CM | POA: Diagnosis not present

## 2021-01-27 DIAGNOSIS — M25651 Stiffness of right hip, not elsewhere classified: Secondary | ICD-10-CM | POA: Diagnosis not present

## 2021-01-27 DIAGNOSIS — M25661 Stiffness of right knee, not elsewhere classified: Secondary | ICD-10-CM | POA: Diagnosis not present

## 2021-01-27 DIAGNOSIS — M6281 Muscle weakness (generalized): Secondary | ICD-10-CM | POA: Diagnosis not present

## 2021-01-27 DIAGNOSIS — M25671 Stiffness of right ankle, not elsewhere classified: Secondary | ICD-10-CM | POA: Diagnosis not present

## 2021-01-27 DIAGNOSIS — R269 Unspecified abnormalities of gait and mobility: Secondary | ICD-10-CM | POA: Diagnosis not present

## 2021-01-29 DIAGNOSIS — R269 Unspecified abnormalities of gait and mobility: Secondary | ICD-10-CM | POA: Diagnosis not present

## 2021-01-29 DIAGNOSIS — M6281 Muscle weakness (generalized): Secondary | ICD-10-CM | POA: Diagnosis not present

## 2021-01-29 DIAGNOSIS — Q774 Achondroplasia: Secondary | ICD-10-CM | POA: Diagnosis not present

## 2021-01-29 DIAGNOSIS — G5781 Other specified mononeuropathies of right lower limb: Secondary | ICD-10-CM | POA: Diagnosis not present

## 2021-01-29 DIAGNOSIS — M25661 Stiffness of right knee, not elsewhere classified: Secondary | ICD-10-CM | POA: Diagnosis not present

## 2021-01-29 DIAGNOSIS — M25651 Stiffness of right hip, not elsewhere classified: Secondary | ICD-10-CM | POA: Diagnosis not present

## 2021-01-29 DIAGNOSIS — Z4789 Encounter for other orthopedic aftercare: Secondary | ICD-10-CM | POA: Diagnosis not present

## 2021-01-29 DIAGNOSIS — M25671 Stiffness of right ankle, not elsewhere classified: Secondary | ICD-10-CM | POA: Diagnosis not present

## 2021-02-01 DIAGNOSIS — R269 Unspecified abnormalities of gait and mobility: Secondary | ICD-10-CM | POA: Diagnosis not present

## 2021-02-01 DIAGNOSIS — M6281 Muscle weakness (generalized): Secondary | ICD-10-CM | POA: Diagnosis not present

## 2021-02-01 DIAGNOSIS — M25651 Stiffness of right hip, not elsewhere classified: Secondary | ICD-10-CM | POA: Diagnosis not present

## 2021-02-01 DIAGNOSIS — M25661 Stiffness of right knee, not elsewhere classified: Secondary | ICD-10-CM | POA: Diagnosis not present

## 2021-02-01 DIAGNOSIS — M25671 Stiffness of right ankle, not elsewhere classified: Secondary | ICD-10-CM | POA: Diagnosis not present

## 2021-02-02 DIAGNOSIS — M25671 Stiffness of right ankle, not elsewhere classified: Secondary | ICD-10-CM | POA: Diagnosis not present

## 2021-02-02 DIAGNOSIS — M25651 Stiffness of right hip, not elsewhere classified: Secondary | ICD-10-CM | POA: Diagnosis not present

## 2021-02-02 DIAGNOSIS — M25661 Stiffness of right knee, not elsewhere classified: Secondary | ICD-10-CM | POA: Diagnosis not present

## 2021-02-02 DIAGNOSIS — M6281 Muscle weakness (generalized): Secondary | ICD-10-CM | POA: Diagnosis not present

## 2021-02-02 DIAGNOSIS — R269 Unspecified abnormalities of gait and mobility: Secondary | ICD-10-CM | POA: Diagnosis not present

## 2021-02-03 DIAGNOSIS — M25661 Stiffness of right knee, not elsewhere classified: Secondary | ICD-10-CM | POA: Diagnosis not present

## 2021-02-03 DIAGNOSIS — M25671 Stiffness of right ankle, not elsewhere classified: Secondary | ICD-10-CM | POA: Diagnosis not present

## 2021-02-03 DIAGNOSIS — M6281 Muscle weakness (generalized): Secondary | ICD-10-CM | POA: Diagnosis not present

## 2021-02-03 DIAGNOSIS — M25651 Stiffness of right hip, not elsewhere classified: Secondary | ICD-10-CM | POA: Diagnosis not present

## 2021-02-03 DIAGNOSIS — R269 Unspecified abnormalities of gait and mobility: Secondary | ICD-10-CM | POA: Diagnosis not present

## 2021-02-04 DIAGNOSIS — M25671 Stiffness of right ankle, not elsewhere classified: Secondary | ICD-10-CM | POA: Diagnosis not present

## 2021-02-04 DIAGNOSIS — M25651 Stiffness of right hip, not elsewhere classified: Secondary | ICD-10-CM | POA: Diagnosis not present

## 2021-02-04 DIAGNOSIS — R269 Unspecified abnormalities of gait and mobility: Secondary | ICD-10-CM | POA: Diagnosis not present

## 2021-02-04 DIAGNOSIS — M6281 Muscle weakness (generalized): Secondary | ICD-10-CM | POA: Diagnosis not present

## 2021-02-04 DIAGNOSIS — M25661 Stiffness of right knee, not elsewhere classified: Secondary | ICD-10-CM | POA: Diagnosis not present

## 2021-02-05 DIAGNOSIS — M25651 Stiffness of right hip, not elsewhere classified: Secondary | ICD-10-CM | POA: Diagnosis not present

## 2021-02-05 DIAGNOSIS — R269 Unspecified abnormalities of gait and mobility: Secondary | ICD-10-CM | POA: Diagnosis not present

## 2021-02-05 DIAGNOSIS — M25661 Stiffness of right knee, not elsewhere classified: Secondary | ICD-10-CM | POA: Diagnosis not present

## 2021-02-05 DIAGNOSIS — M25671 Stiffness of right ankle, not elsewhere classified: Secondary | ICD-10-CM | POA: Diagnosis not present

## 2021-02-05 DIAGNOSIS — M6281 Muscle weakness (generalized): Secondary | ICD-10-CM | POA: Diagnosis not present

## 2021-02-08 DIAGNOSIS — R269 Unspecified abnormalities of gait and mobility: Secondary | ICD-10-CM | POA: Diagnosis not present

## 2021-02-08 DIAGNOSIS — M6281 Muscle weakness (generalized): Secondary | ICD-10-CM | POA: Diagnosis not present

## 2021-02-08 DIAGNOSIS — M25661 Stiffness of right knee, not elsewhere classified: Secondary | ICD-10-CM | POA: Diagnosis not present

## 2021-02-08 DIAGNOSIS — M25651 Stiffness of right hip, not elsewhere classified: Secondary | ICD-10-CM | POA: Diagnosis not present

## 2021-02-08 DIAGNOSIS — M25671 Stiffness of right ankle, not elsewhere classified: Secondary | ICD-10-CM | POA: Diagnosis not present

## 2021-02-09 DIAGNOSIS — M25671 Stiffness of right ankle, not elsewhere classified: Secondary | ICD-10-CM | POA: Diagnosis not present

## 2021-02-09 DIAGNOSIS — M25661 Stiffness of right knee, not elsewhere classified: Secondary | ICD-10-CM | POA: Diagnosis not present

## 2021-02-09 DIAGNOSIS — M25651 Stiffness of right hip, not elsewhere classified: Secondary | ICD-10-CM | POA: Diagnosis not present

## 2021-02-09 DIAGNOSIS — M6281 Muscle weakness (generalized): Secondary | ICD-10-CM | POA: Diagnosis not present

## 2021-02-09 DIAGNOSIS — R269 Unspecified abnormalities of gait and mobility: Secondary | ICD-10-CM | POA: Diagnosis not present

## 2021-02-10 DIAGNOSIS — M25651 Stiffness of right hip, not elsewhere classified: Secondary | ICD-10-CM | POA: Diagnosis not present

## 2021-02-10 DIAGNOSIS — M6281 Muscle weakness (generalized): Secondary | ICD-10-CM | POA: Diagnosis not present

## 2021-02-10 DIAGNOSIS — R269 Unspecified abnormalities of gait and mobility: Secondary | ICD-10-CM | POA: Diagnosis not present

## 2021-02-10 DIAGNOSIS — M25661 Stiffness of right knee, not elsewhere classified: Secondary | ICD-10-CM | POA: Diagnosis not present

## 2021-02-10 DIAGNOSIS — M25671 Stiffness of right ankle, not elsewhere classified: Secondary | ICD-10-CM | POA: Diagnosis not present

## 2021-02-11 DIAGNOSIS — M25661 Stiffness of right knee, not elsewhere classified: Secondary | ICD-10-CM | POA: Diagnosis not present

## 2021-02-11 DIAGNOSIS — M25671 Stiffness of right ankle, not elsewhere classified: Secondary | ICD-10-CM | POA: Diagnosis not present

## 2021-02-11 DIAGNOSIS — M25651 Stiffness of right hip, not elsewhere classified: Secondary | ICD-10-CM | POA: Diagnosis not present

## 2021-02-11 DIAGNOSIS — M6281 Muscle weakness (generalized): Secondary | ICD-10-CM | POA: Diagnosis not present

## 2021-02-11 DIAGNOSIS — R269 Unspecified abnormalities of gait and mobility: Secondary | ICD-10-CM | POA: Diagnosis not present

## 2021-02-12 DIAGNOSIS — M79606 Pain in leg, unspecified: Secondary | ICD-10-CM | POA: Diagnosis not present

## 2021-02-12 DIAGNOSIS — Z4889 Encounter for other specified surgical aftercare: Secondary | ICD-10-CM | POA: Diagnosis not present

## 2021-02-12 DIAGNOSIS — Q774 Achondroplasia: Secondary | ICD-10-CM | POA: Diagnosis not present

## 2021-02-15 DIAGNOSIS — M25651 Stiffness of right hip, not elsewhere classified: Secondary | ICD-10-CM | POA: Diagnosis not present

## 2021-02-15 DIAGNOSIS — M6281 Muscle weakness (generalized): Secondary | ICD-10-CM | POA: Diagnosis not present

## 2021-02-15 DIAGNOSIS — R269 Unspecified abnormalities of gait and mobility: Secondary | ICD-10-CM | POA: Diagnosis not present

## 2021-02-15 DIAGNOSIS — M25661 Stiffness of right knee, not elsewhere classified: Secondary | ICD-10-CM | POA: Diagnosis not present

## 2021-02-15 DIAGNOSIS — M25671 Stiffness of right ankle, not elsewhere classified: Secondary | ICD-10-CM | POA: Diagnosis not present

## 2021-02-16 DIAGNOSIS — M25651 Stiffness of right hip, not elsewhere classified: Secondary | ICD-10-CM | POA: Diagnosis not present

## 2021-02-16 DIAGNOSIS — R269 Unspecified abnormalities of gait and mobility: Secondary | ICD-10-CM | POA: Diagnosis not present

## 2021-02-16 DIAGNOSIS — M6281 Muscle weakness (generalized): Secondary | ICD-10-CM | POA: Diagnosis not present

## 2021-02-16 DIAGNOSIS — Q774 Achondroplasia: Secondary | ICD-10-CM | POA: Diagnosis not present

## 2021-02-16 DIAGNOSIS — M25671 Stiffness of right ankle, not elsewhere classified: Secondary | ICD-10-CM | POA: Diagnosis not present

## 2021-02-16 DIAGNOSIS — Z4789 Encounter for other orthopedic aftercare: Secondary | ICD-10-CM | POA: Diagnosis not present

## 2021-02-16 DIAGNOSIS — M25661 Stiffness of right knee, not elsewhere classified: Secondary | ICD-10-CM | POA: Diagnosis not present

## 2021-02-17 DIAGNOSIS — M25651 Stiffness of right hip, not elsewhere classified: Secondary | ICD-10-CM | POA: Diagnosis not present

## 2021-02-17 DIAGNOSIS — M25671 Stiffness of right ankle, not elsewhere classified: Secondary | ICD-10-CM | POA: Diagnosis not present

## 2021-02-17 DIAGNOSIS — R269 Unspecified abnormalities of gait and mobility: Secondary | ICD-10-CM | POA: Diagnosis not present

## 2021-02-17 DIAGNOSIS — M25661 Stiffness of right knee, not elsewhere classified: Secondary | ICD-10-CM | POA: Diagnosis not present

## 2021-02-17 DIAGNOSIS — M6281 Muscle weakness (generalized): Secondary | ICD-10-CM | POA: Diagnosis not present

## 2021-02-18 DIAGNOSIS — R269 Unspecified abnormalities of gait and mobility: Secondary | ICD-10-CM | POA: Diagnosis not present

## 2021-02-18 DIAGNOSIS — M25651 Stiffness of right hip, not elsewhere classified: Secondary | ICD-10-CM | POA: Diagnosis not present

## 2021-02-18 DIAGNOSIS — M25661 Stiffness of right knee, not elsewhere classified: Secondary | ICD-10-CM | POA: Diagnosis not present

## 2021-02-18 DIAGNOSIS — M6281 Muscle weakness (generalized): Secondary | ICD-10-CM | POA: Diagnosis not present

## 2021-02-18 DIAGNOSIS — M25671 Stiffness of right ankle, not elsewhere classified: Secondary | ICD-10-CM | POA: Diagnosis not present

## 2021-02-19 DIAGNOSIS — R269 Unspecified abnormalities of gait and mobility: Secondary | ICD-10-CM | POA: Diagnosis not present

## 2021-02-19 DIAGNOSIS — M25671 Stiffness of right ankle, not elsewhere classified: Secondary | ICD-10-CM | POA: Diagnosis not present

## 2021-02-19 DIAGNOSIS — M25661 Stiffness of right knee, not elsewhere classified: Secondary | ICD-10-CM | POA: Diagnosis not present

## 2021-02-19 DIAGNOSIS — M25651 Stiffness of right hip, not elsewhere classified: Secondary | ICD-10-CM | POA: Diagnosis not present

## 2021-02-19 DIAGNOSIS — M6281 Muscle weakness (generalized): Secondary | ICD-10-CM | POA: Diagnosis not present

## 2021-02-22 DIAGNOSIS — M25671 Stiffness of right ankle, not elsewhere classified: Secondary | ICD-10-CM | POA: Diagnosis not present

## 2021-02-22 DIAGNOSIS — M6281 Muscle weakness (generalized): Secondary | ICD-10-CM | POA: Diagnosis not present

## 2021-02-22 DIAGNOSIS — M25661 Stiffness of right knee, not elsewhere classified: Secondary | ICD-10-CM | POA: Diagnosis not present

## 2021-02-22 DIAGNOSIS — M25651 Stiffness of right hip, not elsewhere classified: Secondary | ICD-10-CM | POA: Diagnosis not present

## 2021-02-22 DIAGNOSIS — R269 Unspecified abnormalities of gait and mobility: Secondary | ICD-10-CM | POA: Diagnosis not present

## 2021-02-23 DIAGNOSIS — M6281 Muscle weakness (generalized): Secondary | ICD-10-CM | POA: Diagnosis not present

## 2021-02-23 DIAGNOSIS — M25671 Stiffness of right ankle, not elsewhere classified: Secondary | ICD-10-CM | POA: Diagnosis not present

## 2021-02-23 DIAGNOSIS — M25651 Stiffness of right hip, not elsewhere classified: Secondary | ICD-10-CM | POA: Diagnosis not present

## 2021-02-23 DIAGNOSIS — R269 Unspecified abnormalities of gait and mobility: Secondary | ICD-10-CM | POA: Diagnosis not present

## 2021-02-23 DIAGNOSIS — M25661 Stiffness of right knee, not elsewhere classified: Secondary | ICD-10-CM | POA: Diagnosis not present

## 2021-02-24 DIAGNOSIS — M6281 Muscle weakness (generalized): Secondary | ICD-10-CM | POA: Diagnosis not present

## 2021-02-24 DIAGNOSIS — R269 Unspecified abnormalities of gait and mobility: Secondary | ICD-10-CM | POA: Diagnosis not present

## 2021-02-24 DIAGNOSIS — M25671 Stiffness of right ankle, not elsewhere classified: Secondary | ICD-10-CM | POA: Diagnosis not present

## 2021-02-24 DIAGNOSIS — M25661 Stiffness of right knee, not elsewhere classified: Secondary | ICD-10-CM | POA: Diagnosis not present

## 2021-02-24 DIAGNOSIS — M25651 Stiffness of right hip, not elsewhere classified: Secondary | ICD-10-CM | POA: Diagnosis not present

## 2021-02-25 DIAGNOSIS — M25651 Stiffness of right hip, not elsewhere classified: Secondary | ICD-10-CM | POA: Diagnosis not present

## 2021-02-25 DIAGNOSIS — M25671 Stiffness of right ankle, not elsewhere classified: Secondary | ICD-10-CM | POA: Diagnosis not present

## 2021-02-25 DIAGNOSIS — M6281 Muscle weakness (generalized): Secondary | ICD-10-CM | POA: Diagnosis not present

## 2021-02-25 DIAGNOSIS — R269 Unspecified abnormalities of gait and mobility: Secondary | ICD-10-CM | POA: Diagnosis not present

## 2021-02-25 DIAGNOSIS — M25661 Stiffness of right knee, not elsewhere classified: Secondary | ICD-10-CM | POA: Diagnosis not present

## 2021-02-26 DIAGNOSIS — Q774 Achondroplasia: Secondary | ICD-10-CM | POA: Diagnosis not present

## 2021-02-26 DIAGNOSIS — M25651 Stiffness of right hip, not elsewhere classified: Secondary | ICD-10-CM | POA: Diagnosis not present

## 2021-02-26 DIAGNOSIS — M25671 Stiffness of right ankle, not elsewhere classified: Secondary | ICD-10-CM | POA: Diagnosis not present

## 2021-02-26 DIAGNOSIS — M6281 Muscle weakness (generalized): Secondary | ICD-10-CM | POA: Diagnosis not present

## 2021-02-26 DIAGNOSIS — Z4789 Encounter for other orthopedic aftercare: Secondary | ICD-10-CM | POA: Diagnosis not present

## 2021-02-26 DIAGNOSIS — R269 Unspecified abnormalities of gait and mobility: Secondary | ICD-10-CM | POA: Diagnosis not present

## 2021-02-26 DIAGNOSIS — M25661 Stiffness of right knee, not elsewhere classified: Secondary | ICD-10-CM | POA: Diagnosis not present

## 2021-03-01 DIAGNOSIS — M6281 Muscle weakness (generalized): Secondary | ICD-10-CM | POA: Diagnosis not present

## 2021-03-01 DIAGNOSIS — M25671 Stiffness of right ankle, not elsewhere classified: Secondary | ICD-10-CM | POA: Diagnosis not present

## 2021-03-01 DIAGNOSIS — R269 Unspecified abnormalities of gait and mobility: Secondary | ICD-10-CM | POA: Diagnosis not present

## 2021-03-01 DIAGNOSIS — M25651 Stiffness of right hip, not elsewhere classified: Secondary | ICD-10-CM | POA: Diagnosis not present

## 2021-03-01 DIAGNOSIS — M25661 Stiffness of right knee, not elsewhere classified: Secondary | ICD-10-CM | POA: Diagnosis not present

## 2021-03-09 DIAGNOSIS — M25671 Stiffness of right ankle, not elsewhere classified: Secondary | ICD-10-CM | POA: Diagnosis not present

## 2021-03-09 DIAGNOSIS — M25651 Stiffness of right hip, not elsewhere classified: Secondary | ICD-10-CM | POA: Diagnosis not present

## 2021-03-09 DIAGNOSIS — M6281 Muscle weakness (generalized): Secondary | ICD-10-CM | POA: Diagnosis not present

## 2021-03-09 DIAGNOSIS — M25661 Stiffness of right knee, not elsewhere classified: Secondary | ICD-10-CM | POA: Diagnosis not present

## 2021-03-09 DIAGNOSIS — R269 Unspecified abnormalities of gait and mobility: Secondary | ICD-10-CM | POA: Diagnosis not present

## 2021-03-10 DIAGNOSIS — M25651 Stiffness of right hip, not elsewhere classified: Secondary | ICD-10-CM | POA: Diagnosis not present

## 2021-03-10 DIAGNOSIS — M25661 Stiffness of right knee, not elsewhere classified: Secondary | ICD-10-CM | POA: Diagnosis not present

## 2021-03-10 DIAGNOSIS — M25671 Stiffness of right ankle, not elsewhere classified: Secondary | ICD-10-CM | POA: Diagnosis not present

## 2021-03-10 DIAGNOSIS — M6281 Muscle weakness (generalized): Secondary | ICD-10-CM | POA: Diagnosis not present

## 2021-03-10 DIAGNOSIS — R269 Unspecified abnormalities of gait and mobility: Secondary | ICD-10-CM | POA: Diagnosis not present

## 2021-03-11 DIAGNOSIS — M25661 Stiffness of right knee, not elsewhere classified: Secondary | ICD-10-CM | POA: Diagnosis not present

## 2021-03-11 DIAGNOSIS — M25651 Stiffness of right hip, not elsewhere classified: Secondary | ICD-10-CM | POA: Diagnosis not present

## 2021-03-11 DIAGNOSIS — M6281 Muscle weakness (generalized): Secondary | ICD-10-CM | POA: Diagnosis not present

## 2021-03-11 DIAGNOSIS — R269 Unspecified abnormalities of gait and mobility: Secondary | ICD-10-CM | POA: Diagnosis not present

## 2021-03-11 DIAGNOSIS — M25671 Stiffness of right ankle, not elsewhere classified: Secondary | ICD-10-CM | POA: Diagnosis not present

## 2021-03-12 DIAGNOSIS — Z4789 Encounter for other orthopedic aftercare: Secondary | ICD-10-CM | POA: Diagnosis not present

## 2021-03-12 DIAGNOSIS — M25661 Stiffness of right knee, not elsewhere classified: Secondary | ICD-10-CM | POA: Diagnosis not present

## 2021-03-12 DIAGNOSIS — M6281 Muscle weakness (generalized): Secondary | ICD-10-CM | POA: Diagnosis not present

## 2021-03-12 DIAGNOSIS — M25651 Stiffness of right hip, not elsewhere classified: Secondary | ICD-10-CM | POA: Diagnosis not present

## 2021-03-12 DIAGNOSIS — R269 Unspecified abnormalities of gait and mobility: Secondary | ICD-10-CM | POA: Diagnosis not present

## 2021-03-12 DIAGNOSIS — M25671 Stiffness of right ankle, not elsewhere classified: Secondary | ICD-10-CM | POA: Diagnosis not present

## 2021-03-12 DIAGNOSIS — Q774 Achondroplasia: Secondary | ICD-10-CM | POA: Diagnosis not present

## 2021-03-15 DIAGNOSIS — M25651 Stiffness of right hip, not elsewhere classified: Secondary | ICD-10-CM | POA: Diagnosis not present

## 2021-03-15 DIAGNOSIS — M6281 Muscle weakness (generalized): Secondary | ICD-10-CM | POA: Diagnosis not present

## 2021-03-15 DIAGNOSIS — M25661 Stiffness of right knee, not elsewhere classified: Secondary | ICD-10-CM | POA: Diagnosis not present

## 2021-03-15 DIAGNOSIS — R269 Unspecified abnormalities of gait and mobility: Secondary | ICD-10-CM | POA: Diagnosis not present

## 2021-03-15 DIAGNOSIS — M25671 Stiffness of right ankle, not elsewhere classified: Secondary | ICD-10-CM | POA: Diagnosis not present

## 2021-03-16 DIAGNOSIS — M6281 Muscle weakness (generalized): Secondary | ICD-10-CM | POA: Diagnosis not present

## 2021-03-16 DIAGNOSIS — M25651 Stiffness of right hip, not elsewhere classified: Secondary | ICD-10-CM | POA: Diagnosis not present

## 2021-03-16 DIAGNOSIS — M25671 Stiffness of right ankle, not elsewhere classified: Secondary | ICD-10-CM | POA: Diagnosis not present

## 2021-03-16 DIAGNOSIS — R269 Unspecified abnormalities of gait and mobility: Secondary | ICD-10-CM | POA: Diagnosis not present

## 2021-03-16 DIAGNOSIS — M25661 Stiffness of right knee, not elsewhere classified: Secondary | ICD-10-CM | POA: Diagnosis not present

## 2021-03-17 DIAGNOSIS — M25651 Stiffness of right hip, not elsewhere classified: Secondary | ICD-10-CM | POA: Diagnosis not present

## 2021-03-17 DIAGNOSIS — M25661 Stiffness of right knee, not elsewhere classified: Secondary | ICD-10-CM | POA: Diagnosis not present

## 2021-03-17 DIAGNOSIS — R269 Unspecified abnormalities of gait and mobility: Secondary | ICD-10-CM | POA: Diagnosis not present

## 2021-03-17 DIAGNOSIS — M6281 Muscle weakness (generalized): Secondary | ICD-10-CM | POA: Diagnosis not present

## 2021-03-17 DIAGNOSIS — M25671 Stiffness of right ankle, not elsewhere classified: Secondary | ICD-10-CM | POA: Diagnosis not present

## 2021-03-18 DIAGNOSIS — R269 Unspecified abnormalities of gait and mobility: Secondary | ICD-10-CM | POA: Diagnosis not present

## 2021-03-18 DIAGNOSIS — M25651 Stiffness of right hip, not elsewhere classified: Secondary | ICD-10-CM | POA: Diagnosis not present

## 2021-03-18 DIAGNOSIS — M25661 Stiffness of right knee, not elsewhere classified: Secondary | ICD-10-CM | POA: Diagnosis not present

## 2021-03-18 DIAGNOSIS — M25671 Stiffness of right ankle, not elsewhere classified: Secondary | ICD-10-CM | POA: Diagnosis not present

## 2021-03-18 DIAGNOSIS — M6281 Muscle weakness (generalized): Secondary | ICD-10-CM | POA: Diagnosis not present

## 2021-03-19 DIAGNOSIS — M25671 Stiffness of right ankle, not elsewhere classified: Secondary | ICD-10-CM | POA: Diagnosis not present

## 2021-03-19 DIAGNOSIS — M25661 Stiffness of right knee, not elsewhere classified: Secondary | ICD-10-CM | POA: Diagnosis not present

## 2021-03-19 DIAGNOSIS — M6281 Muscle weakness (generalized): Secondary | ICD-10-CM | POA: Diagnosis not present

## 2021-03-19 DIAGNOSIS — M25651 Stiffness of right hip, not elsewhere classified: Secondary | ICD-10-CM | POA: Diagnosis not present

## 2021-03-19 DIAGNOSIS — R269 Unspecified abnormalities of gait and mobility: Secondary | ICD-10-CM | POA: Diagnosis not present

## 2021-03-22 DIAGNOSIS — M25651 Stiffness of right hip, not elsewhere classified: Secondary | ICD-10-CM | POA: Diagnosis not present

## 2021-03-22 DIAGNOSIS — M25671 Stiffness of right ankle, not elsewhere classified: Secondary | ICD-10-CM | POA: Diagnosis not present

## 2021-03-22 DIAGNOSIS — M25661 Stiffness of right knee, not elsewhere classified: Secondary | ICD-10-CM | POA: Diagnosis not present

## 2021-03-22 DIAGNOSIS — M6281 Muscle weakness (generalized): Secondary | ICD-10-CM | POA: Diagnosis not present

## 2021-03-22 DIAGNOSIS — R269 Unspecified abnormalities of gait and mobility: Secondary | ICD-10-CM | POA: Diagnosis not present

## 2021-03-23 DIAGNOSIS — M6281 Muscle weakness (generalized): Secondary | ICD-10-CM | POA: Diagnosis not present

## 2021-03-23 DIAGNOSIS — M25651 Stiffness of right hip, not elsewhere classified: Secondary | ICD-10-CM | POA: Diagnosis not present

## 2021-03-23 DIAGNOSIS — M25671 Stiffness of right ankle, not elsewhere classified: Secondary | ICD-10-CM | POA: Diagnosis not present

## 2021-03-23 DIAGNOSIS — R269 Unspecified abnormalities of gait and mobility: Secondary | ICD-10-CM | POA: Diagnosis not present

## 2021-03-23 DIAGNOSIS — M25661 Stiffness of right knee, not elsewhere classified: Secondary | ICD-10-CM | POA: Diagnosis not present

## 2021-03-24 DIAGNOSIS — M25651 Stiffness of right hip, not elsewhere classified: Secondary | ICD-10-CM | POA: Diagnosis not present

## 2021-03-24 DIAGNOSIS — M25671 Stiffness of right ankle, not elsewhere classified: Secondary | ICD-10-CM | POA: Diagnosis not present

## 2021-03-24 DIAGNOSIS — M6281 Muscle weakness (generalized): Secondary | ICD-10-CM | POA: Diagnosis not present

## 2021-03-24 DIAGNOSIS — M25661 Stiffness of right knee, not elsewhere classified: Secondary | ICD-10-CM | POA: Diagnosis not present

## 2021-03-24 DIAGNOSIS — R269 Unspecified abnormalities of gait and mobility: Secondary | ICD-10-CM | POA: Diagnosis not present

## 2021-03-25 DIAGNOSIS — R269 Unspecified abnormalities of gait and mobility: Secondary | ICD-10-CM | POA: Diagnosis not present

## 2021-03-25 DIAGNOSIS — M25651 Stiffness of right hip, not elsewhere classified: Secondary | ICD-10-CM | POA: Diagnosis not present

## 2021-03-25 DIAGNOSIS — M6281 Muscle weakness (generalized): Secondary | ICD-10-CM | POA: Diagnosis not present

## 2021-03-25 DIAGNOSIS — M25661 Stiffness of right knee, not elsewhere classified: Secondary | ICD-10-CM | POA: Diagnosis not present

## 2021-03-25 DIAGNOSIS — M25671 Stiffness of right ankle, not elsewhere classified: Secondary | ICD-10-CM | POA: Diagnosis not present

## 2021-03-26 DIAGNOSIS — M25661 Stiffness of right knee, not elsewhere classified: Secondary | ICD-10-CM | POA: Diagnosis not present

## 2021-03-26 DIAGNOSIS — Q774 Achondroplasia: Secondary | ICD-10-CM | POA: Diagnosis not present

## 2021-03-26 DIAGNOSIS — M6281 Muscle weakness (generalized): Secondary | ICD-10-CM | POA: Diagnosis not present

## 2021-03-26 DIAGNOSIS — Z9889 Other specified postprocedural states: Secondary | ICD-10-CM | POA: Diagnosis not present

## 2021-03-26 DIAGNOSIS — R269 Unspecified abnormalities of gait and mobility: Secondary | ICD-10-CM | POA: Diagnosis not present

## 2021-03-26 DIAGNOSIS — M25651 Stiffness of right hip, not elsewhere classified: Secondary | ICD-10-CM | POA: Diagnosis not present

## 2021-03-26 DIAGNOSIS — M25671 Stiffness of right ankle, not elsewhere classified: Secondary | ICD-10-CM | POA: Diagnosis not present

## 2021-03-26 DIAGNOSIS — Z4789 Encounter for other orthopedic aftercare: Secondary | ICD-10-CM | POA: Diagnosis not present

## 2021-03-29 DIAGNOSIS — M25671 Stiffness of right ankle, not elsewhere classified: Secondary | ICD-10-CM | POA: Diagnosis not present

## 2021-03-29 DIAGNOSIS — M6281 Muscle weakness (generalized): Secondary | ICD-10-CM | POA: Diagnosis not present

## 2021-03-29 DIAGNOSIS — R269 Unspecified abnormalities of gait and mobility: Secondary | ICD-10-CM | POA: Diagnosis not present

## 2021-03-29 DIAGNOSIS — M25661 Stiffness of right knee, not elsewhere classified: Secondary | ICD-10-CM | POA: Diagnosis not present

## 2021-03-29 DIAGNOSIS — M25651 Stiffness of right hip, not elsewhere classified: Secondary | ICD-10-CM | POA: Diagnosis not present

## 2021-03-30 DIAGNOSIS — M25661 Stiffness of right knee, not elsewhere classified: Secondary | ICD-10-CM | POA: Diagnosis not present

## 2021-03-30 DIAGNOSIS — M25651 Stiffness of right hip, not elsewhere classified: Secondary | ICD-10-CM | POA: Diagnosis not present

## 2021-03-30 DIAGNOSIS — M25671 Stiffness of right ankle, not elsewhere classified: Secondary | ICD-10-CM | POA: Diagnosis not present

## 2021-03-30 DIAGNOSIS — R269 Unspecified abnormalities of gait and mobility: Secondary | ICD-10-CM | POA: Diagnosis not present

## 2021-03-30 DIAGNOSIS — M6281 Muscle weakness (generalized): Secondary | ICD-10-CM | POA: Diagnosis not present

## 2021-03-31 DIAGNOSIS — M6281 Muscle weakness (generalized): Secondary | ICD-10-CM | POA: Diagnosis not present

## 2021-03-31 DIAGNOSIS — M25661 Stiffness of right knee, not elsewhere classified: Secondary | ICD-10-CM | POA: Diagnosis not present

## 2021-03-31 DIAGNOSIS — M25671 Stiffness of right ankle, not elsewhere classified: Secondary | ICD-10-CM | POA: Diagnosis not present

## 2021-03-31 DIAGNOSIS — R269 Unspecified abnormalities of gait and mobility: Secondary | ICD-10-CM | POA: Diagnosis not present

## 2021-03-31 DIAGNOSIS — M25651 Stiffness of right hip, not elsewhere classified: Secondary | ICD-10-CM | POA: Diagnosis not present

## 2021-04-01 DIAGNOSIS — M25651 Stiffness of right hip, not elsewhere classified: Secondary | ICD-10-CM | POA: Diagnosis not present

## 2021-04-01 DIAGNOSIS — M25661 Stiffness of right knee, not elsewhere classified: Secondary | ICD-10-CM | POA: Diagnosis not present

## 2021-04-01 DIAGNOSIS — M6281 Muscle weakness (generalized): Secondary | ICD-10-CM | POA: Diagnosis not present

## 2021-04-01 DIAGNOSIS — R269 Unspecified abnormalities of gait and mobility: Secondary | ICD-10-CM | POA: Diagnosis not present

## 2021-04-01 DIAGNOSIS — M25671 Stiffness of right ankle, not elsewhere classified: Secondary | ICD-10-CM | POA: Diagnosis not present

## 2021-04-13 DIAGNOSIS — M25671 Stiffness of right ankle, not elsewhere classified: Secondary | ICD-10-CM | POA: Diagnosis not present

## 2021-04-13 DIAGNOSIS — M6281 Muscle weakness (generalized): Secondary | ICD-10-CM | POA: Diagnosis not present

## 2021-04-13 DIAGNOSIS — R269 Unspecified abnormalities of gait and mobility: Secondary | ICD-10-CM | POA: Diagnosis not present

## 2021-04-13 DIAGNOSIS — M25651 Stiffness of right hip, not elsewhere classified: Secondary | ICD-10-CM | POA: Diagnosis not present

## 2021-04-13 DIAGNOSIS — M25661 Stiffness of right knee, not elsewhere classified: Secondary | ICD-10-CM | POA: Diagnosis not present

## 2021-04-14 DIAGNOSIS — M25671 Stiffness of right ankle, not elsewhere classified: Secondary | ICD-10-CM | POA: Diagnosis not present

## 2021-04-14 DIAGNOSIS — M25651 Stiffness of right hip, not elsewhere classified: Secondary | ICD-10-CM | POA: Diagnosis not present

## 2021-04-14 DIAGNOSIS — M25661 Stiffness of right knee, not elsewhere classified: Secondary | ICD-10-CM | POA: Diagnosis not present

## 2021-04-14 DIAGNOSIS — M6281 Muscle weakness (generalized): Secondary | ICD-10-CM | POA: Diagnosis not present

## 2021-04-14 DIAGNOSIS — R269 Unspecified abnormalities of gait and mobility: Secondary | ICD-10-CM | POA: Diagnosis not present

## 2021-04-15 DIAGNOSIS — R269 Unspecified abnormalities of gait and mobility: Secondary | ICD-10-CM | POA: Diagnosis not present

## 2021-04-15 DIAGNOSIS — M25651 Stiffness of right hip, not elsewhere classified: Secondary | ICD-10-CM | POA: Diagnosis not present

## 2021-04-15 DIAGNOSIS — M25671 Stiffness of right ankle, not elsewhere classified: Secondary | ICD-10-CM | POA: Diagnosis not present

## 2021-04-15 DIAGNOSIS — M6281 Muscle weakness (generalized): Secondary | ICD-10-CM | POA: Diagnosis not present

## 2021-04-15 DIAGNOSIS — M25661 Stiffness of right knee, not elsewhere classified: Secondary | ICD-10-CM | POA: Diagnosis not present

## 2021-04-16 DIAGNOSIS — M6281 Muscle weakness (generalized): Secondary | ICD-10-CM | POA: Diagnosis not present

## 2021-04-16 DIAGNOSIS — M25671 Stiffness of right ankle, not elsewhere classified: Secondary | ICD-10-CM | POA: Diagnosis not present

## 2021-04-16 DIAGNOSIS — R269 Unspecified abnormalities of gait and mobility: Secondary | ICD-10-CM | POA: Diagnosis not present

## 2021-04-16 DIAGNOSIS — M25661 Stiffness of right knee, not elsewhere classified: Secondary | ICD-10-CM | POA: Diagnosis not present

## 2021-04-16 DIAGNOSIS — Q774 Achondroplasia: Secondary | ICD-10-CM | POA: Diagnosis not present

## 2021-04-16 DIAGNOSIS — M25651 Stiffness of right hip, not elsewhere classified: Secondary | ICD-10-CM | POA: Diagnosis not present

## 2021-04-16 DIAGNOSIS — Z4789 Encounter for other orthopedic aftercare: Secondary | ICD-10-CM | POA: Diagnosis not present

## 2021-04-16 DIAGNOSIS — Z9889 Other specified postprocedural states: Secondary | ICD-10-CM | POA: Diagnosis not present

## 2021-04-19 DIAGNOSIS — R269 Unspecified abnormalities of gait and mobility: Secondary | ICD-10-CM | POA: Diagnosis not present

## 2021-04-19 DIAGNOSIS — M25651 Stiffness of right hip, not elsewhere classified: Secondary | ICD-10-CM | POA: Diagnosis not present

## 2021-04-19 DIAGNOSIS — M6281 Muscle weakness (generalized): Secondary | ICD-10-CM | POA: Diagnosis not present

## 2021-04-19 DIAGNOSIS — M25661 Stiffness of right knee, not elsewhere classified: Secondary | ICD-10-CM | POA: Diagnosis not present

## 2021-04-19 DIAGNOSIS — M25671 Stiffness of right ankle, not elsewhere classified: Secondary | ICD-10-CM | POA: Diagnosis not present

## 2021-04-20 DIAGNOSIS — M25671 Stiffness of right ankle, not elsewhere classified: Secondary | ICD-10-CM | POA: Diagnosis not present

## 2021-04-20 DIAGNOSIS — M25651 Stiffness of right hip, not elsewhere classified: Secondary | ICD-10-CM | POA: Diagnosis not present

## 2021-04-20 DIAGNOSIS — M6281 Muscle weakness (generalized): Secondary | ICD-10-CM | POA: Diagnosis not present

## 2021-04-20 DIAGNOSIS — M25661 Stiffness of right knee, not elsewhere classified: Secondary | ICD-10-CM | POA: Diagnosis not present

## 2021-04-20 DIAGNOSIS — R269 Unspecified abnormalities of gait and mobility: Secondary | ICD-10-CM | POA: Diagnosis not present

## 2021-04-21 DIAGNOSIS — M25661 Stiffness of right knee, not elsewhere classified: Secondary | ICD-10-CM | POA: Diagnosis not present

## 2021-04-21 DIAGNOSIS — M6281 Muscle weakness (generalized): Secondary | ICD-10-CM | POA: Diagnosis not present

## 2021-04-21 DIAGNOSIS — R269 Unspecified abnormalities of gait and mobility: Secondary | ICD-10-CM | POA: Diagnosis not present

## 2021-04-21 DIAGNOSIS — M25671 Stiffness of right ankle, not elsewhere classified: Secondary | ICD-10-CM | POA: Diagnosis not present

## 2021-04-21 DIAGNOSIS — M25651 Stiffness of right hip, not elsewhere classified: Secondary | ICD-10-CM | POA: Diagnosis not present

## 2021-04-22 DIAGNOSIS — M25661 Stiffness of right knee, not elsewhere classified: Secondary | ICD-10-CM | POA: Diagnosis not present

## 2021-04-22 DIAGNOSIS — M25651 Stiffness of right hip, not elsewhere classified: Secondary | ICD-10-CM | POA: Diagnosis not present

## 2021-04-22 DIAGNOSIS — R269 Unspecified abnormalities of gait and mobility: Secondary | ICD-10-CM | POA: Diagnosis not present

## 2021-04-22 DIAGNOSIS — M6281 Muscle weakness (generalized): Secondary | ICD-10-CM | POA: Diagnosis not present

## 2021-04-22 DIAGNOSIS — M25671 Stiffness of right ankle, not elsewhere classified: Secondary | ICD-10-CM | POA: Diagnosis not present

## 2021-04-23 DIAGNOSIS — M25661 Stiffness of right knee, not elsewhere classified: Secondary | ICD-10-CM | POA: Diagnosis not present

## 2021-04-23 DIAGNOSIS — M6281 Muscle weakness (generalized): Secondary | ICD-10-CM | POA: Diagnosis not present

## 2021-04-23 DIAGNOSIS — M25671 Stiffness of right ankle, not elsewhere classified: Secondary | ICD-10-CM | POA: Diagnosis not present

## 2021-04-23 DIAGNOSIS — M25651 Stiffness of right hip, not elsewhere classified: Secondary | ICD-10-CM | POA: Diagnosis not present

## 2021-04-23 DIAGNOSIS — R269 Unspecified abnormalities of gait and mobility: Secondary | ICD-10-CM | POA: Diagnosis not present

## 2021-04-26 DIAGNOSIS — M25661 Stiffness of right knee, not elsewhere classified: Secondary | ICD-10-CM | POA: Diagnosis not present

## 2021-04-26 DIAGNOSIS — M6281 Muscle weakness (generalized): Secondary | ICD-10-CM | POA: Diagnosis not present

## 2021-04-26 DIAGNOSIS — R269 Unspecified abnormalities of gait and mobility: Secondary | ICD-10-CM | POA: Diagnosis not present

## 2021-04-26 DIAGNOSIS — M25671 Stiffness of right ankle, not elsewhere classified: Secondary | ICD-10-CM | POA: Diagnosis not present

## 2021-04-26 DIAGNOSIS — Z9889 Other specified postprocedural states: Secondary | ICD-10-CM | POA: Diagnosis not present

## 2021-04-26 DIAGNOSIS — M25651 Stiffness of right hip, not elsewhere classified: Secondary | ICD-10-CM | POA: Diagnosis not present

## 2021-04-26 DIAGNOSIS — Q774 Achondroplasia: Secondary | ICD-10-CM | POA: Diagnosis not present

## 2021-04-27 DIAGNOSIS — M25671 Stiffness of right ankle, not elsewhere classified: Secondary | ICD-10-CM | POA: Diagnosis not present

## 2021-04-27 DIAGNOSIS — M6281 Muscle weakness (generalized): Secondary | ICD-10-CM | POA: Diagnosis not present

## 2021-04-27 DIAGNOSIS — M25661 Stiffness of right knee, not elsewhere classified: Secondary | ICD-10-CM | POA: Diagnosis not present

## 2021-04-27 DIAGNOSIS — R269 Unspecified abnormalities of gait and mobility: Secondary | ICD-10-CM | POA: Diagnosis not present

## 2021-04-27 DIAGNOSIS — M25651 Stiffness of right hip, not elsewhere classified: Secondary | ICD-10-CM | POA: Diagnosis not present

## 2021-04-28 DIAGNOSIS — M25671 Stiffness of right ankle, not elsewhere classified: Secondary | ICD-10-CM | POA: Diagnosis not present

## 2021-04-28 DIAGNOSIS — M25661 Stiffness of right knee, not elsewhere classified: Secondary | ICD-10-CM | POA: Diagnosis not present

## 2021-04-28 DIAGNOSIS — M25651 Stiffness of right hip, not elsewhere classified: Secondary | ICD-10-CM | POA: Diagnosis not present

## 2021-04-28 DIAGNOSIS — R269 Unspecified abnormalities of gait and mobility: Secondary | ICD-10-CM | POA: Diagnosis not present

## 2021-04-28 DIAGNOSIS — M6281 Muscle weakness (generalized): Secondary | ICD-10-CM | POA: Diagnosis not present

## 2021-04-29 DIAGNOSIS — M6281 Muscle weakness (generalized): Secondary | ICD-10-CM | POA: Diagnosis not present

## 2021-04-29 DIAGNOSIS — M25651 Stiffness of right hip, not elsewhere classified: Secondary | ICD-10-CM | POA: Diagnosis not present

## 2021-04-29 DIAGNOSIS — R269 Unspecified abnormalities of gait and mobility: Secondary | ICD-10-CM | POA: Diagnosis not present

## 2021-04-29 DIAGNOSIS — M25661 Stiffness of right knee, not elsewhere classified: Secondary | ICD-10-CM | POA: Diagnosis not present

## 2021-04-29 DIAGNOSIS — M25671 Stiffness of right ankle, not elsewhere classified: Secondary | ICD-10-CM | POA: Diagnosis not present

## 2021-04-30 DIAGNOSIS — M6281 Muscle weakness (generalized): Secondary | ICD-10-CM | POA: Diagnosis not present

## 2021-04-30 DIAGNOSIS — M25671 Stiffness of right ankle, not elsewhere classified: Secondary | ICD-10-CM | POA: Diagnosis not present

## 2021-04-30 DIAGNOSIS — R269 Unspecified abnormalities of gait and mobility: Secondary | ICD-10-CM | POA: Diagnosis not present

## 2021-04-30 DIAGNOSIS — M25651 Stiffness of right hip, not elsewhere classified: Secondary | ICD-10-CM | POA: Diagnosis not present

## 2021-04-30 DIAGNOSIS — M25661 Stiffness of right knee, not elsewhere classified: Secondary | ICD-10-CM | POA: Diagnosis not present

## 2021-05-03 DIAGNOSIS — M25661 Stiffness of right knee, not elsewhere classified: Secondary | ICD-10-CM | POA: Diagnosis not present

## 2021-05-03 DIAGNOSIS — R269 Unspecified abnormalities of gait and mobility: Secondary | ICD-10-CM | POA: Diagnosis not present

## 2021-05-03 DIAGNOSIS — M25651 Stiffness of right hip, not elsewhere classified: Secondary | ICD-10-CM | POA: Diagnosis not present

## 2021-05-03 DIAGNOSIS — M6281 Muscle weakness (generalized): Secondary | ICD-10-CM | POA: Diagnosis not present

## 2021-05-03 DIAGNOSIS — M25671 Stiffness of right ankle, not elsewhere classified: Secondary | ICD-10-CM | POA: Diagnosis not present

## 2021-05-04 DIAGNOSIS — R269 Unspecified abnormalities of gait and mobility: Secondary | ICD-10-CM | POA: Diagnosis not present

## 2021-05-04 DIAGNOSIS — M25671 Stiffness of right ankle, not elsewhere classified: Secondary | ICD-10-CM | POA: Diagnosis not present

## 2021-05-04 DIAGNOSIS — M25651 Stiffness of right hip, not elsewhere classified: Secondary | ICD-10-CM | POA: Diagnosis not present

## 2021-05-04 DIAGNOSIS — M25661 Stiffness of right knee, not elsewhere classified: Secondary | ICD-10-CM | POA: Diagnosis not present

## 2021-05-04 DIAGNOSIS — M6281 Muscle weakness (generalized): Secondary | ICD-10-CM | POA: Diagnosis not present

## 2021-05-05 DIAGNOSIS — M25671 Stiffness of right ankle, not elsewhere classified: Secondary | ICD-10-CM | POA: Diagnosis not present

## 2021-05-05 DIAGNOSIS — M25661 Stiffness of right knee, not elsewhere classified: Secondary | ICD-10-CM | POA: Diagnosis not present

## 2021-05-05 DIAGNOSIS — M6281 Muscle weakness (generalized): Secondary | ICD-10-CM | POA: Diagnosis not present

## 2021-05-05 DIAGNOSIS — M25651 Stiffness of right hip, not elsewhere classified: Secondary | ICD-10-CM | POA: Diagnosis not present

## 2021-05-05 DIAGNOSIS — R269 Unspecified abnormalities of gait and mobility: Secondary | ICD-10-CM | POA: Diagnosis not present

## 2021-05-06 DIAGNOSIS — M25671 Stiffness of right ankle, not elsewhere classified: Secondary | ICD-10-CM | POA: Diagnosis not present

## 2021-05-06 DIAGNOSIS — R269 Unspecified abnormalities of gait and mobility: Secondary | ICD-10-CM | POA: Diagnosis not present

## 2021-05-06 DIAGNOSIS — M6281 Muscle weakness (generalized): Secondary | ICD-10-CM | POA: Diagnosis not present

## 2021-05-06 DIAGNOSIS — M25661 Stiffness of right knee, not elsewhere classified: Secondary | ICD-10-CM | POA: Diagnosis not present

## 2021-05-06 DIAGNOSIS — M25651 Stiffness of right hip, not elsewhere classified: Secondary | ICD-10-CM | POA: Diagnosis not present

## 2021-05-07 DIAGNOSIS — M25671 Stiffness of right ankle, not elsewhere classified: Secondary | ICD-10-CM | POA: Diagnosis not present

## 2021-05-07 DIAGNOSIS — R269 Unspecified abnormalities of gait and mobility: Secondary | ICD-10-CM | POA: Diagnosis not present

## 2021-05-07 DIAGNOSIS — M25651 Stiffness of right hip, not elsewhere classified: Secondary | ICD-10-CM | POA: Diagnosis not present

## 2021-05-07 DIAGNOSIS — M6281 Muscle weakness (generalized): Secondary | ICD-10-CM | POA: Diagnosis not present

## 2021-05-07 DIAGNOSIS — M25661 Stiffness of right knee, not elsewhere classified: Secondary | ICD-10-CM | POA: Diagnosis not present

## 2021-05-10 DIAGNOSIS — R531 Weakness: Secondary | ICD-10-CM | POA: Diagnosis not present

## 2021-05-10 DIAGNOSIS — M25671 Stiffness of right ankle, not elsewhere classified: Secondary | ICD-10-CM | POA: Diagnosis not present

## 2021-05-10 DIAGNOSIS — M25672 Stiffness of left ankle, not elsewhere classified: Secondary | ICD-10-CM | POA: Diagnosis not present

## 2021-05-10 DIAGNOSIS — R269 Unspecified abnormalities of gait and mobility: Secondary | ICD-10-CM | POA: Diagnosis not present

## 2021-05-10 DIAGNOSIS — Q774 Achondroplasia: Secondary | ICD-10-CM | POA: Diagnosis not present

## 2021-05-10 DIAGNOSIS — Z9889 Other specified postprocedural states: Secondary | ICD-10-CM | POA: Diagnosis not present

## 2021-05-10 DIAGNOSIS — Z4789 Encounter for other orthopedic aftercare: Secondary | ICD-10-CM | POA: Diagnosis not present

## 2021-05-12 DIAGNOSIS — Q774 Achondroplasia: Secondary | ICD-10-CM | POA: Diagnosis not present

## 2021-05-12 DIAGNOSIS — M25671 Stiffness of right ankle, not elsewhere classified: Secondary | ICD-10-CM | POA: Diagnosis not present

## 2021-05-12 DIAGNOSIS — Z9889 Other specified postprocedural states: Secondary | ICD-10-CM | POA: Diagnosis not present

## 2021-05-12 DIAGNOSIS — R531 Weakness: Secondary | ICD-10-CM | POA: Diagnosis not present

## 2021-05-12 DIAGNOSIS — R269 Unspecified abnormalities of gait and mobility: Secondary | ICD-10-CM | POA: Diagnosis not present

## 2021-05-12 DIAGNOSIS — M25672 Stiffness of left ankle, not elsewhere classified: Secondary | ICD-10-CM | POA: Diagnosis not present

## 2021-05-12 DIAGNOSIS — Z4789 Encounter for other orthopedic aftercare: Secondary | ICD-10-CM | POA: Diagnosis not present

## 2021-05-27 ENCOUNTER — Other Ambulatory Visit: Payer: Self-pay | Admitting: Orthopedic Surgery

## 2021-05-27 ENCOUNTER — Ambulatory Visit
Admission: RE | Admit: 2021-05-27 | Discharge: 2021-05-27 | Disposition: A | Discharge status: Home / Self Care | Payer: BC Managed Care – PPO | Source: Ambulatory Visit | Attending: Orthopedic Surgery | Admitting: Orthopedic Surgery

## 2021-05-27 DIAGNOSIS — M79651 Pain in right thigh: Secondary | ICD-10-CM | POA: Diagnosis not present

## 2021-05-27 DIAGNOSIS — Q6589 Other specified congenital deformities of hip: Secondary | ICD-10-CM | POA: Diagnosis not present

## 2021-05-27 DIAGNOSIS — M898X5 Other specified disorders of bone, thigh: Secondary | ICD-10-CM

## 2021-05-27 DIAGNOSIS — M79605 Pain in left leg: Secondary | ICD-10-CM | POA: Diagnosis not present

## 2021-05-27 DIAGNOSIS — Z9889 Other specified postprocedural states: Secondary | ICD-10-CM

## 2021-05-27 DIAGNOSIS — Z0389 Encounter for observation for other suspected diseases and conditions ruled out: Secondary | ICD-10-CM | POA: Diagnosis not present

## 2021-06-03 DIAGNOSIS — Q774 Achondroplasia: Secondary | ICD-10-CM | POA: Diagnosis not present

## 2021-06-03 DIAGNOSIS — Z9889 Other specified postprocedural states: Secondary | ICD-10-CM | POA: Diagnosis not present

## 2021-06-10 DIAGNOSIS — M9906 Segmental and somatic dysfunction of lower extremity: Secondary | ICD-10-CM | POA: Diagnosis not present

## 2021-06-10 DIAGNOSIS — M9905 Segmental and somatic dysfunction of pelvic region: Secondary | ICD-10-CM | POA: Diagnosis not present

## 2021-06-10 DIAGNOSIS — M9903 Segmental and somatic dysfunction of lumbar region: Secondary | ICD-10-CM | POA: Diagnosis not present

## 2021-06-10 DIAGNOSIS — M1711 Unilateral primary osteoarthritis, right knee: Secondary | ICD-10-CM | POA: Diagnosis not present

## 2021-06-10 DIAGNOSIS — M9902 Segmental and somatic dysfunction of thoracic region: Secondary | ICD-10-CM | POA: Diagnosis not present

## 2021-06-14 DIAGNOSIS — M9906 Segmental and somatic dysfunction of lower extremity: Secondary | ICD-10-CM | POA: Diagnosis not present

## 2021-06-14 DIAGNOSIS — M9905 Segmental and somatic dysfunction of pelvic region: Secondary | ICD-10-CM | POA: Diagnosis not present

## 2021-06-14 DIAGNOSIS — M1712 Unilateral primary osteoarthritis, left knee: Secondary | ICD-10-CM | POA: Diagnosis not present

## 2021-06-14 DIAGNOSIS — M9902 Segmental and somatic dysfunction of thoracic region: Secondary | ICD-10-CM | POA: Diagnosis not present

## 2021-06-14 DIAGNOSIS — M9903 Segmental and somatic dysfunction of lumbar region: Secondary | ICD-10-CM | POA: Diagnosis not present

## 2021-06-16 DIAGNOSIS — M9903 Segmental and somatic dysfunction of lumbar region: Secondary | ICD-10-CM | POA: Diagnosis not present

## 2021-06-16 DIAGNOSIS — M9905 Segmental and somatic dysfunction of pelvic region: Secondary | ICD-10-CM | POA: Diagnosis not present

## 2021-06-16 DIAGNOSIS — M1712 Unilateral primary osteoarthritis, left knee: Secondary | ICD-10-CM | POA: Diagnosis not present

## 2021-06-16 DIAGNOSIS — M9902 Segmental and somatic dysfunction of thoracic region: Secondary | ICD-10-CM | POA: Diagnosis not present

## 2021-06-16 DIAGNOSIS — M9906 Segmental and somatic dysfunction of lower extremity: Secondary | ICD-10-CM | POA: Diagnosis not present

## 2021-06-21 DIAGNOSIS — M7661 Achilles tendinitis, right leg: Secondary | ICD-10-CM | POA: Diagnosis not present

## 2021-06-21 DIAGNOSIS — M9905 Segmental and somatic dysfunction of pelvic region: Secondary | ICD-10-CM | POA: Diagnosis not present

## 2021-06-21 DIAGNOSIS — M9902 Segmental and somatic dysfunction of thoracic region: Secondary | ICD-10-CM | POA: Diagnosis not present

## 2021-06-21 DIAGNOSIS — M9906 Segmental and somatic dysfunction of lower extremity: Secondary | ICD-10-CM | POA: Diagnosis not present

## 2021-06-21 DIAGNOSIS — M9903 Segmental and somatic dysfunction of lumbar region: Secondary | ICD-10-CM | POA: Diagnosis not present

## 2021-06-24 DIAGNOSIS — M9906 Segmental and somatic dysfunction of lower extremity: Secondary | ICD-10-CM | POA: Diagnosis not present

## 2021-06-24 DIAGNOSIS — M9903 Segmental and somatic dysfunction of lumbar region: Secondary | ICD-10-CM | POA: Diagnosis not present

## 2021-06-24 DIAGNOSIS — M9905 Segmental and somatic dysfunction of pelvic region: Secondary | ICD-10-CM | POA: Diagnosis not present

## 2021-06-24 DIAGNOSIS — M9902 Segmental and somatic dysfunction of thoracic region: Secondary | ICD-10-CM | POA: Diagnosis not present

## 2021-06-24 DIAGNOSIS — M1711 Unilateral primary osteoarthritis, right knee: Secondary | ICD-10-CM | POA: Diagnosis not present

## 2021-06-28 DIAGNOSIS — M9906 Segmental and somatic dysfunction of lower extremity: Secondary | ICD-10-CM | POA: Diagnosis not present

## 2021-06-28 DIAGNOSIS — M1711 Unilateral primary osteoarthritis, right knee: Secondary | ICD-10-CM | POA: Diagnosis not present

## 2021-06-28 DIAGNOSIS — M9902 Segmental and somatic dysfunction of thoracic region: Secondary | ICD-10-CM | POA: Diagnosis not present

## 2021-06-28 DIAGNOSIS — M9903 Segmental and somatic dysfunction of lumbar region: Secondary | ICD-10-CM | POA: Diagnosis not present

## 2021-06-28 DIAGNOSIS — M9905 Segmental and somatic dysfunction of pelvic region: Secondary | ICD-10-CM | POA: Diagnosis not present

## 2021-07-01 DIAGNOSIS — M9906 Segmental and somatic dysfunction of lower extremity: Secondary | ICD-10-CM | POA: Diagnosis not present

## 2021-07-01 DIAGNOSIS — M9902 Segmental and somatic dysfunction of thoracic region: Secondary | ICD-10-CM | POA: Diagnosis not present

## 2021-07-01 DIAGNOSIS — M9903 Segmental and somatic dysfunction of lumbar region: Secondary | ICD-10-CM | POA: Diagnosis not present

## 2021-07-01 DIAGNOSIS — M9905 Segmental and somatic dysfunction of pelvic region: Secondary | ICD-10-CM | POA: Diagnosis not present

## 2021-07-01 DIAGNOSIS — M7612 Psoas tendinitis, left hip: Secondary | ICD-10-CM | POA: Diagnosis not present

## 2021-07-05 DIAGNOSIS — M6281 Muscle weakness (generalized): Secondary | ICD-10-CM | POA: Diagnosis not present

## 2021-07-05 DIAGNOSIS — M25651 Stiffness of right hip, not elsewhere classified: Secondary | ICD-10-CM | POA: Diagnosis not present

## 2021-07-05 DIAGNOSIS — M25671 Stiffness of right ankle, not elsewhere classified: Secondary | ICD-10-CM | POA: Diagnosis not present

## 2021-07-05 DIAGNOSIS — R269 Unspecified abnormalities of gait and mobility: Secondary | ICD-10-CM | POA: Diagnosis not present

## 2021-07-05 DIAGNOSIS — M25661 Stiffness of right knee, not elsewhere classified: Secondary | ICD-10-CM | POA: Diagnosis not present

## 2021-07-07 DIAGNOSIS — M25651 Stiffness of right hip, not elsewhere classified: Secondary | ICD-10-CM | POA: Diagnosis not present

## 2021-07-07 DIAGNOSIS — M6281 Muscle weakness (generalized): Secondary | ICD-10-CM | POA: Diagnosis not present

## 2021-07-07 DIAGNOSIS — M25671 Stiffness of right ankle, not elsewhere classified: Secondary | ICD-10-CM | POA: Diagnosis not present

## 2021-07-07 DIAGNOSIS — R269 Unspecified abnormalities of gait and mobility: Secondary | ICD-10-CM | POA: Diagnosis not present

## 2021-07-07 DIAGNOSIS — M25661 Stiffness of right knee, not elsewhere classified: Secondary | ICD-10-CM | POA: Diagnosis not present

## 2021-07-08 DIAGNOSIS — M25651 Stiffness of right hip, not elsewhere classified: Secondary | ICD-10-CM | POA: Diagnosis not present

## 2021-07-08 DIAGNOSIS — R269 Unspecified abnormalities of gait and mobility: Secondary | ICD-10-CM | POA: Diagnosis not present

## 2021-07-08 DIAGNOSIS — M25671 Stiffness of right ankle, not elsewhere classified: Secondary | ICD-10-CM | POA: Diagnosis not present

## 2021-07-08 DIAGNOSIS — M6281 Muscle weakness (generalized): Secondary | ICD-10-CM | POA: Diagnosis not present

## 2021-07-08 DIAGNOSIS — M25661 Stiffness of right knee, not elsewhere classified: Secondary | ICD-10-CM | POA: Diagnosis not present

## 2021-07-09 DIAGNOSIS — Q774 Achondroplasia: Secondary | ICD-10-CM | POA: Diagnosis not present

## 2021-07-09 DIAGNOSIS — Z9889 Other specified postprocedural states: Secondary | ICD-10-CM | POA: Diagnosis not present

## 2021-08-18 ENCOUNTER — Other Ambulatory Visit: Payer: Self-pay | Admitting: Orthopedic Surgery

## 2021-08-18 ENCOUNTER — Ambulatory Visit
Admission: RE | Admit: 2021-08-18 | Discharge: 2021-08-18 | Disposition: A | Discharge status: Home / Self Care | Payer: BC Managed Care – PPO | Source: Ambulatory Visit | Attending: Orthopedic Surgery | Admitting: Orthopedic Surgery

## 2021-08-18 DIAGNOSIS — G35 Multiple sclerosis: Secondary | ICD-10-CM | POA: Diagnosis not present

## 2021-08-18 DIAGNOSIS — Z9889 Other specified postprocedural states: Secondary | ICD-10-CM

## 2021-08-18 DIAGNOSIS — M85861 Other specified disorders of bone density and structure, right lower leg: Secondary | ICD-10-CM | POA: Diagnosis not present

## 2021-09-08 ENCOUNTER — Encounter: Payer: Self-pay | Admitting: Nurse Practitioner

## 2021-09-08 ENCOUNTER — Ambulatory Visit (INDEPENDENT_AMBULATORY_CARE_PROVIDER_SITE_OTHER): Payer: BC Managed Care – PPO | Admitting: Nurse Practitioner

## 2021-09-08 VITALS — BP 110/60 | HR 61 | Temp 98.1°F | Ht <= 58 in | Wt 93.8 lb

## 2021-09-08 DIAGNOSIS — Z01818 Encounter for other preprocedural examination: Secondary | ICD-10-CM

## 2021-09-08 DIAGNOSIS — L2082 Flexural eczema: Secondary | ICD-10-CM | POA: Diagnosis not present

## 2021-09-08 DIAGNOSIS — Q774 Achondroplasia: Secondary | ICD-10-CM

## 2021-09-08 LAB — COMPREHENSIVE METABOLIC PANEL
ALT: 8 U/L (ref 0–35)
AST: 15 U/L (ref 0–37)
Albumin: 4.3 g/dL (ref 3.5–5.2)
Alkaline Phosphatase: 95 U/L (ref 39–117)
BUN: 15 mg/dL (ref 6–23)
CO2: 26 mEq/L (ref 19–32)
Calcium: 9.7 mg/dL (ref 8.4–10.5)
Chloride: 101 mEq/L (ref 96–112)
Creatinine, Ser: 0.49 mg/dL (ref 0.40–1.20)
GFR: 132.06 mL/min (ref >60.00)
Glucose, Bld: 79 mg/dL (ref 70–99)
Potassium: 3.7 mEq/L (ref 3.5–5.1)
Sodium: 135 mEq/L (ref 135–145)
Total Bilirubin: 0.5 mg/dL (ref 0.2–1.2)
Total Protein: 7.5 g/dL (ref 6.0–8.3)

## 2021-09-08 LAB — PROTIME-INR
INR: 0.9 ratio (ref 0.8–1.0)
Prothrombin Time: 10.3 s (ref 9.6–13.1)

## 2021-09-08 LAB — APTT: aPTT: 40.2 s — ABNORMAL HIGH (ref 25.4–36.8)

## 2021-09-08 LAB — CBC
HCT: 39.1 % (ref 36.0–46.0)
Hemoglobin: 13.2 g/dL (ref 12.0–15.0)
MCHC: 33.7 g/dL (ref 30.0–36.0)
MCV: 94.8 fl (ref 78.0–100.0)
Platelets: 308 10*3/uL (ref 150.0–400.0)
RBC: 4.12 Mil/uL (ref 3.87–5.11)
RDW: 13 % (ref 11.5–15.5)
WBC: 4.9 10*3/uL (ref 4.0–10.5)

## 2021-09-08 LAB — HCG, QUANTITATIVE, PREGNANCY: Quantitative HCG: 0.67 m[IU]/mL

## 2021-09-08 MED ORDER — HYDROCORTISONE 2.5 % EX OINT: TOPICAL_OINTMENT | Freq: Two times a day (BID) | CUTANEOUS | 1 refills | Status: DC

## 2021-09-08 NOTE — Progress Notes (Unsigned)
Established Patient Visit  Patient: Andrea Harrison   DOB: 1995-05-16   26 y.o. Female  MRN: [id number] Visit Date: 09/09/2021  Subjective:    Chief Complaint  Patient presents with   Office Visit    Pre- op appt Surgery on 6/15 to have fixatores removed. No other concerns.   Accompanied by mother.  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 She is scheduled to have bilateral tibia external fixators removed, on 09/23/2021 by Dr. Edson Snowball with [facility]. Today she denies any acute complains. No hx of lung disease, No Fhx of anesthesia complication or hypercoagulation disorder or bleeding disorder.  The following labs requested: CBC, CMP, HCG, PT/PTT/INR, and urinalysis.  History reviewed. No pertinent family history.   Past Surgical History:  Procedure Laterality Date   LEG SURGERY     WISDOM TOOTH EXTRACTION      Reviewed medical, surgical, and social history today  Medications: Outpatient Medications Prior to Visit  Medication Sig   ACETAMINOPHEN EXTRA STRENGTH 500 MG tablet Take 1,000 mg by mouth every 6 (six) hours.   [DISCONTINUED] amoxicillin (AMOXIL) 500 MG tablet Take 1 tablet (500 mg total) by mouth 2 (two) times daily. (Patient not taking: Reported on 09/08/2021)   [DISCONTINUED] azithromycin (ZITHROMAX) 250 MG tablet Take as directed (Patient not taking: Reported on 09/08/2021)   [DISCONTINUED] benzonatate (TESSALON) 100 MG capsule Take 1 capsule (100 mg total) by mouth 2 (two) times daily as needed for cough. (Patient not taking: Reported on 09/08/2021)   [DISCONTINUED] metroNIDAZOLE (METROGEL VAGINAL) 0.75 % vaginal gel Place 1 Applicatorful vaginally at bedtime. (Patient not taking: Reported on 09/08/2021)   No facility-administered medications prior to visit.   Reviewed past  medical and social history.   ROS per HPI above      Objective:  BP 110/60 (BP Location: Left Arm, Patient Position: Sitting, Cuff Size: Small)   Pulse 61   Temp 98.1 F (36.7 C) (Temporal)   Ht 4' 3.5" (1.308 m)   Wt 93 lb 12.8 oz (42.5 kg)   SpO2 98%   BMI 24.87 kg/m      Physical Exam Exam conducted with a chaperone present.  HENT:     Right Ear: Tympanic membrane, ear canal and external ear normal.     Left Ear: Tympanic membrane, ear canal and external ear normal.     Nose: Nose normal.  Eyes:     Extraocular Movements: Extraocular movements intact.     Conjunctiva/sclera: Conjunctivae normal.     Pupils: Pupils are equal, round, and reactive to light.  Cardiovascular:     Rate and Rhythm: Normal rate and regular rhythm.     Pulses: Normal pulses.     Heart sounds: Normal heart sounds.  Pulmonary:     Effort: Pulmonary effort is normal.     Breath sounds: Normal breath sounds.  Chest:  Breasts:    Breasts are symmetrical.     Right: Normal.     Left: Normal.  Abdominal:     General: Bowel sounds are normal.     Palpations: Abdomen is soft.  Musculoskeletal:     Cervical back: Normal range of motion and neck supple.     Right lower leg: No edema.     Left lower leg: No edema.  Lymphadenopathy:  Cervical: No cervical adenopathy.     Upper Body:     Right upper body: No supraclavicular, axillary or pectoral adenopathy.     Left upper body: No supraclavicular, axillary or pectoral adenopathy.  Skin:    General: Skin is warm and dry.     Findings: Rash present.     Comments: Scaly rash on neck  Neurological:     Mental Status: She is alert and oriented to person, place, and time.  Psychiatric:        Mood and Affect: Mood normal.        Behavior: Behavior normal.        Thought Content: Thought content normal.    Results for orders placed or performed in visit on 09/08/21  CBC  Result Value Ref Range   WBC 4.9 4.0 - 10.5 K/uL   RBC 4.12 3.87 -  5.11 Mil/uL   Platelets 308.0 150.0 - 400.0 K/uL   Hemoglobin 13.2 12.0 - 15.0 g/dL   HCT 40.9 81.1 - 91.4 %   MCV 94.8 78.0 - 100.0 fl   MCHC 33.7 30.0 - 36.0 g/dL   RDW 78.2 95.6 - 21.3 %  Comprehensive metabolic panel  Result Value Ref Range   Sodium 135 135 - 145 mEq/L   Potassium 3.7 3.5 - 5.1 mEq/L   Chloride 101 96 - 112 mEq/L   CO2 26 19 - 32 mEq/L   Glucose, Bld 79 70 - 99 mg/dL   BUN 15 6 - 23 mg/dL   Creatinine, Ser 0.86 0.40 - 1.20 mg/dL   Total Bilirubin 0.5 0.2 - 1.2 mg/dL   Alkaline Phosphatase 95 39 - 117 U/L   AST 15 0 - 37 U/L   ALT 8 0 - 35 U/L   Total Protein 7.5 6.0 - 8.3 g/dL   Albumin 4.3 3.5 - 5.2 g/dL   GFR 578.46 >96.29 mL/min   Calcium 9.7 8.4 - 10.5 mg/dL  Urinalysis w microscopic + reflex cultur   Specimen: Blood  Result Value Ref Range   Color, Urine YELLOW YELLOW   APPearance CLEAR CLEAR   Specific Gravity, Urine 1.017 1.001 - 1.035   pH 5.5 5.0 - 8.0   Glucose, UA NEGATIVE NEGATIVE   Bilirubin Urine NEGATIVE NEGATIVE   Ketones, ur NEGATIVE NEGATIVE   Hgb urine dipstick NEGATIVE NEGATIVE   Protein, ur NEGATIVE NEGATIVE   Nitrites, Initial NEGATIVE NEGATIVE   Leukocyte Esterase NEGATIVE NEGATIVE   WBC, UA NONE SEEN 0 - 5 /HPF   RBC / HPF NONE SEEN 0 - 2 /HPF   Squamous Epithelial / LPF 0-5 < OR = 5 /HPF   Bacteria, UA FEW (A) NONE SEEN /HPF   Hyaline Cast NONE SEEN NONE SEEN /LPF   Note    PTT  Result Value Ref Range   aPTT 40.2 (H) 25.4 - 36.8 SEC  Protime-INR  Result Value Ref Range   INR 0.9 0.8 - 1.0 ratio   Prothrombin Time 10.3 9.6 - 13.1 sec  B-HCG Quant  Result Value Ref Range   Quantitative HCG 0.67 mIU/ml  REFLEXIVE URINE CULTURE  Result Value Ref Range   Reflexve Urine Culture        Assessment & Plan:    Problem List Items Addressed This Visit       Musculoskeletal and Integument   Achondroplasia syndrome    S/p Bilateral femoral lengthening osteotomies with retained hardware and Bilateral Tibia osteotomies  with retained external fixators 62months ago by [facility] in [place], [place]. She is scheduled to have bilateral tibia external fixators removed, on 09/23/2021 by Dr. Edson SnowballMichael Assayag with [facility]. Today she denies any acute complains. No hx of lung disease, No Fhx of anesthesia complication or hypercoagulation disorder or bleeding disorder.  The following labs requested: CBC, CMP, HCG, PT/PTT/INR, and urinalysis.       Other Visit Diagnoses     Preoperative clearance    -  Primary   Relevant Orders   CBC (Completed)   Comprehensive metabolic panel (Completed)   Urinalysis w microscopic + reflex cultur (Completed)   PTT (Completed)   Protime-INR (Completed)   B-HCG Quant (Completed)   Flexural eczema       Relevant Medications   hydrocortisone 2.5 % ointment      Return in about 1 year (around 09/09/2022) for CPE (fasting).     Alysia Pennaharlotte Skyeler Smola, NP

## 2021-09-08 NOTE — Assessment & Plan Note (Signed)
S/p Bilateral femoral lengthening osteotomies with retained hardware and Bilateral Tibia osteotomies with retained external fixators 31months ago by [facility] in [place], [place]. She is scheduled to have bilateral tibia external fixators removed, on 09/23/2021 by Dr. Edson Snowball with [facility]. Today she denies any acute complains. No hx of lung disease, No Fhx of anesthesia complication or hypercoagulation disorder or bleeding disorder.  The following labs requested: CBC, CMP, HCG, PT/PTT/INR, and urinalysis.

## 2021-09-08 NOTE — Patient Instructions (Addendum)
Go to lab Form and lab results will be faxed once completed.

## 2021-09-09 LAB — URINALYSIS W MICROSCOPIC + REFLEX CULTURE
Bilirubin Urine: NEGATIVE
Glucose, UA: NEGATIVE
Hgb urine dipstick: NEGATIVE
Hyaline Cast: NONE SEEN /LPF
Ketones, ur: NEGATIVE
Leukocyte Esterase: NEGATIVE
Nitrites, Initial: NEGATIVE
Protein, ur: NEGATIVE
RBC / HPF: NONE SEEN /HPF (ref 0–2)
Specific Gravity, Urine: 1.017 (ref 1.001–1.035)
WBC, UA: NONE SEEN /HPF (ref 0–5)
pH: 5.5 (ref 5.0–8.0)

## 2021-09-09 LAB — NO CULTURE INDICATED

## 2021-09-20 DIAGNOSIS — Q774 Achondroplasia: Secondary | ICD-10-CM | POA: Diagnosis not present

## 2021-09-20 DIAGNOSIS — Z01818 Encounter for other preprocedural examination: Secondary | ICD-10-CM | POA: Diagnosis not present

## 2021-09-22 DIAGNOSIS — Q774 Achondroplasia: Secondary | ICD-10-CM | POA: Diagnosis not present

## 2021-09-22 DIAGNOSIS — Z472 Encounter for removal of internal fixation device: Secondary | ICD-10-CM | POA: Diagnosis not present

## 2021-09-22 DIAGNOSIS — Z4789 Encounter for other orthopedic aftercare: Secondary | ICD-10-CM | POA: Diagnosis not present

## 2021-10-15 ENCOUNTER — Ambulatory Visit
Admission: RE | Admit: 2021-10-15 | Discharge: 2021-10-15 | Disposition: A | Discharge status: Home / Self Care | Payer: BC Managed Care – PPO | Source: Ambulatory Visit | Attending: Orthopaedic Surgery | Admitting: Orthopaedic Surgery

## 2021-10-15 ENCOUNTER — Other Ambulatory Visit: Payer: Self-pay | Admitting: Orthopaedic Surgery

## 2021-10-15 DIAGNOSIS — M21762 Unequal limb length (acquired), left tibia: Secondary | ICD-10-CM | POA: Diagnosis not present

## 2021-10-15 DIAGNOSIS — Z9889 Other specified postprocedural states: Secondary | ICD-10-CM

## 2021-10-15 DIAGNOSIS — M85872 Other specified disorders of bone density and structure, left ankle and foot: Secondary | ICD-10-CM | POA: Diagnosis not present

## 2021-10-15 DIAGNOSIS — M21761 Unequal limb length (acquired), right tibia: Secondary | ICD-10-CM | POA: Diagnosis not present

## 2021-10-15 DIAGNOSIS — M85871 Other specified disorders of bone density and structure, right ankle and foot: Secondary | ICD-10-CM | POA: Diagnosis not present

## 2021-12-21 ENCOUNTER — Encounter: Payer: Self-pay | Admitting: Nurse Practitioner

## 2021-12-21 ENCOUNTER — Ambulatory Visit (INDEPENDENT_AMBULATORY_CARE_PROVIDER_SITE_OTHER): Payer: BC Managed Care – PPO | Admitting: Nurse Practitioner

## 2021-12-21 VITALS — BP 106/58 | HR 60 | Temp 97.8°F | Ht <= 58 in | Wt 93.0 lb

## 2021-12-21 DIAGNOSIS — Q774 Achondroplasia: Secondary | ICD-10-CM | POA: Diagnosis not present

## 2021-12-21 DIAGNOSIS — Z01818 Encounter for other preprocedural examination: Secondary | ICD-10-CM | POA: Diagnosis not present

## 2021-12-21 LAB — COMPREHENSIVE METABOLIC PANEL
ALT: 6 U/L (ref 0–35)
AST: 14 U/L (ref 0–37)
Albumin: 4.2 g/dL (ref 3.5–5.2)
Alkaline Phosphatase: 109 U/L (ref 39–117)
BUN: 10 mg/dL (ref 6–23)
CO2: 24 mEq/L (ref 19–32)
Calcium: 9.4 mg/dL (ref 8.4–10.5)
Chloride: 103 mEq/L (ref 96–112)
Creatinine, Ser: 0.47 mg/dL (ref 0.40–1.20)
GFR: 133.13 mL/min (ref >60.00)
Glucose, Bld: 84 mg/dL (ref 70–99)
Potassium: 3.8 mEq/L (ref 3.5–5.1)
Sodium: 135 mEq/L (ref 135–145)
Total Bilirubin: 0.6 mg/dL (ref 0.2–1.2)
Total Protein: 7.5 g/dL (ref 6.0–8.3)

## 2021-12-21 LAB — CBC
HCT: 39.7 % (ref 36.0–46.0)
Hemoglobin: 13.3 g/dL (ref 12.0–15.0)
MCHC: 33.4 g/dL (ref 30.0–36.0)
MCV: 90.7 fl (ref 78.0–100.0)
Platelets: 270 10*3/uL (ref 150.0–400.0)
RBC: 4.38 Mil/uL (ref 3.87–5.11)
RDW: 13.1 % (ref 11.5–15.5)
WBC: 4.3 10*3/uL (ref 4.0–10.5)

## 2021-12-21 LAB — PROTIME-INR
INR: 0.9 ratio (ref 0.8–1.0)
Prothrombin Time: 10.5 s (ref 9.6–13.1)

## 2021-12-21 LAB — POCT URINE PREGNANCY: Preg Test, Ur: NEGATIVE

## 2021-12-21 LAB — APTT: aPTT: 39.5 s — ABNORMAL HIGH (ref 25.4–36.8)

## 2021-12-21 NOTE — Patient Instructions (Signed)
Go to lab

## 2021-12-21 NOTE — Progress Notes (Unsigned)
Established Patient Visit  Patient: Andrea Harrison   DOB: Jan 23, 1996   26 y.o. Female  MRN: [id number] Visit Date: 12/22/2021  Subjective:    Chief Complaint  Patient presents with   Annual Exam    Cpe / pre op forms  Pt fasting  No concerns today    HPI Accompanied by mother. Achondroplasia syndrome S/p bilateral LE osteoplasty on 09/2021: healed, external fixation removed. Denies any surgical complications She is now scheduled for bilateral UE limb lengthening and reconstruction (osteoplasty application of mono-lateral lengthening external fixator by Dr. Edson Snowball at [facility] on 01/23/2022.  Normal exam Ordered preop labs and completed form  Reviewed medical, surgical, and social history today  Medications: Outpatient Medications Prior to Visit  Medication Sig   ACETAMINOPHEN EXTRA STRENGTH 500 MG tablet Take 1,000 mg by mouth every 6 (six) hours.   hydrocortisone 2.5 % ointment Apply topically 2 (two) times daily. (Patient not taking: Reported on 12/21/2021)   No facility-administered medications prior to visit.   Reviewed past medical and social history.   ROS per HPI above  Last CBC Lab Results  Component Value Date   WBC 4.3 12/21/2021   HGB 13.3 12/21/2021   HCT 39.7 12/21/2021   MCV 90.7 12/21/2021   RDW 13.1 12/21/2021   PLT 270.0 12/21/2021   Last metabolic panel Lab Results  Component Value Date   GLUCOSE 84 12/21/2021   NA 135 12/21/2021   K 3.8 12/21/2021   CL 103 12/21/2021   CO2 24 12/21/2021   BUN 10 12/21/2021   CREATININE 0.47 12/21/2021   CALCIUM 9.4 12/21/2021   PROT 7.5 12/21/2021   ALBUMIN 4.2 12/21/2021   BILITOT 0.6 12/21/2021   ALKPHOS 109 12/21/2021   AST 14 12/21/2021   ALT 6 12/21/2021   Last thyroid functions Lab Results  Component Value Date   TSH 2.46 09/30/2019      Objective:  BP (!) 106/58 (BP Location: Right Arm, Patient Position: Sitting, Cuff Size: Small)   Pulse  60   Temp 97.8 F (36.6 C) (Temporal)   Ht 4' 3.5" (1.308 m)   Wt 93 lb (42.2 kg)   SpO2 99%   BMI 24.65 kg/m      Physical Exam Constitutional:      General: She is not in acute distress. HENT:     Right Ear: Tympanic membrane, ear canal and external ear normal.     Left Ear: Tympanic membrane, ear canal and external ear normal.     Nose: Nose normal.  Eyes:     General: No scleral icterus.    Extraocular Movements: Extraocular movements intact.     Conjunctiva/sclera: Conjunctivae normal.  Cardiovascular:     Rate and Rhythm: Normal rate and regular rhythm.     Pulses: Normal pulses.     Heart sounds: Normal heart sounds.  Pulmonary:     Effort: Pulmonary effort is normal. No respiratory distress.     Breath sounds: Normal breath sounds.  Abdominal:     General: Bowel sounds are normal. There is no distension.     Palpations: Abdomen is soft.  Musculoskeletal:        General: Normal range of motion.     Cervical back: Normal range of motion and neck supple.     Right lower leg: No edema.     Left lower leg: No edema.  Lymphadenopathy:  Cervical: No cervical adenopathy.  Skin:    General: Skin is warm and dry.  Neurological:     Mental Status: She is alert and oriented to person, place, and time.  Psychiatric:        Mood and Affect: Mood normal.        Behavior: Behavior normal.        Thought Content: Thought content normal.     Results for orders placed or performed in visit on 12/21/21  CBC  Result Value Ref Range   WBC 4.3 4.0 - 10.5 K/uL   RBC 4.38 3.87 - 5.11 Mil/uL   Platelets 270.0 150.0 - 400.0 K/uL   Hemoglobin 13.3 12.0 - 15.0 g/dL   HCT 03.5 00.9 - 38.1 %   MCV 90.7 78.0 - 100.0 fl   MCHC 33.4 30.0 - 36.0 g/dL   RDW 82.9 93.7 - 16.9 %  Comprehensive metabolic panel  Result Value Ref Range   Sodium 135 135 - 145 mEq/L   Potassium 3.8 3.5 - 5.1 mEq/L   Chloride 103 96 - 112 mEq/L   CO2 24 19 - 32 mEq/L   Glucose, Bld 84 70 - 99 mg/dL    BUN 10 6 - 23 mg/dL   Creatinine, Ser 6.78 0.40 - 1.20 mg/dL   Total Bilirubin 0.6 0.2 - 1.2 mg/dL   Alkaline Phosphatase 109 39 - 117 U/L   AST 14 0 - 37 U/L   ALT 6 0 - 35 U/L   Total Protein 7.5 6.0 - 8.3 g/dL   Albumin 4.2 3.5 - 5.2 g/dL   GFR 938.10 >17.51 mL/min   Calcium 9.4 8.4 - 10.5 mg/dL  Urinalysis w microscopic + reflex cultur   Specimen: Blood  Result Value Ref Range   Color, Urine YELLOW YELLOW   APPearance CLEAR CLEAR   Specific Gravity, Urine 1.002 1.001 - 1.035   pH 7.0 5.0 - 8.0   Glucose, UA NEGATIVE NEGATIVE   Bilirubin Urine NEGATIVE NEGATIVE   Ketones, ur NEGATIVE NEGATIVE   Hgb urine dipstick NEGATIVE NEGATIVE   Protein, ur NEGATIVE NEGATIVE   Nitrites, Initial NEGATIVE NEGATIVE   Leukocyte Esterase NEGATIVE NEGATIVE   WBC, UA NONE SEEN 0 - 5 /HPF   RBC / HPF NONE SEEN 0 - 2 /HPF   Squamous Epithelial / LPF NONE SEEN < OR = 5 /HPF   Bacteria, UA NONE SEEN NONE SEEN /HPF   Hyaline Cast NONE SEEN NONE SEEN /LPF   Note    Protime-INR  Result Value Ref Range   INR 0.9 0.8 - 1.0 ratio   Prothrombin Time 10.5 9.6 - 13.1 sec  PTT  Result Value Ref Range   aPTT 39.5 (H) 25.4 - 36.8 SEC  REFLEXIVE URINE CULTURE  Result Value Ref Range   Reflexve Urine Culture    POCT urine pregnancy  Result Value Ref Range   Preg Test, Ur Negative Negative      Assessment & Plan:    Problem List Items Addressed This Visit       Musculoskeletal and Integument   Achondroplasia syndrome    S/p bilateral LE osteoplasty on 09/2021: healed, external fixation removed. Denies any surgical complications She is now scheduled for bilateral UE limb lengthening and reconstruction (osteoplasty application of mono-lateral lengthening external fixator by Dr. Edson Snowball at [facility] on 01/23/2022.  Normal exam Ordered preop labs and completed form      Other Visit Diagnoses  Preoperative clearance    -  Primary   Relevant Orders   CBC  (Completed)   Comprehensive metabolic panel (Completed)   Urinalysis w microscopic + reflex cultur (Completed)   Protime-INR (Completed)   PTT (Completed)   POCT urine pregnancy (Completed)      Return if symptoms worsen or fail to improve.     Alysia Penna, NP

## 2021-12-22 LAB — URINALYSIS W MICROSCOPIC + REFLEX CULTURE
Bacteria, UA: NONE SEEN /HPF
Bilirubin Urine: NEGATIVE
Glucose, UA: NEGATIVE
Hgb urine dipstick: NEGATIVE
Hyaline Cast: NONE SEEN /LPF
Ketones, ur: NEGATIVE
Leukocyte Esterase: NEGATIVE
Nitrites, Initial: NEGATIVE
Protein, ur: NEGATIVE
RBC / HPF: NONE SEEN /HPF (ref 0–2)
Specific Gravity, Urine: 1.002 (ref 1.001–1.035)
Squamous Epithelial / HPF: NONE SEEN /HPF (ref <=5)
WBC, UA: NONE SEEN /HPF (ref 0–5)
pH: 7 (ref 5.0–8.0)

## 2021-12-22 LAB — NO CULTURE INDICATED

## 2021-12-22 NOTE — Assessment & Plan Note (Signed)
S/p bilateral LE osteoplasty on 09/2021: healed, external fixation removed. Denies any surgical complications She is now scheduled for bilateral UE limb lengthening and reconstruction (osteoplasty application of mono-lateral lengthening external fixator by Dr. Edson Snowball at [facility] on 01/23/2022.  Normal exam Ordered preop labs and completed form

## 2022-01-10 DIAGNOSIS — Z01818 Encounter for other preprocedural examination: Secondary | ICD-10-CM | POA: Diagnosis not present

## 2022-01-10 DIAGNOSIS — Q774 Achondroplasia: Secondary | ICD-10-CM | POA: Diagnosis not present

## 2022-01-13 DIAGNOSIS — Q774 Achondroplasia: Secondary | ICD-10-CM | POA: Diagnosis not present

## 2022-01-13 DIAGNOSIS — Z9889 Other specified postprocedural states: Secondary | ICD-10-CM | POA: Diagnosis not present

## 2022-01-14 DIAGNOSIS — Q774 Achondroplasia: Secondary | ICD-10-CM | POA: Diagnosis not present

## 2022-01-31 DIAGNOSIS — Z9889 Other specified postprocedural states: Secondary | ICD-10-CM | POA: Diagnosis not present

## 2022-02-11 ENCOUNTER — Ambulatory Visit
Admission: RE | Admit: 2022-02-11 | Discharge: 2022-02-11 | Disposition: A | Discharge status: Home / Self Care | Payer: BC Managed Care – PPO | Source: Ambulatory Visit | Attending: Orthopaedic Surgery | Admitting: Orthopaedic Surgery

## 2022-02-11 ENCOUNTER — Other Ambulatory Visit: Payer: Self-pay | Admitting: Orthopaedic Surgery

## 2022-02-11 DIAGNOSIS — Z9889 Other specified postprocedural states: Secondary | ICD-10-CM

## 2022-02-11 DIAGNOSIS — Z4789 Encounter for other orthopedic aftercare: Secondary | ICD-10-CM | POA: Diagnosis not present

## 2022-02-16 DIAGNOSIS — Z4789 Encounter for other orthopedic aftercare: Secondary | ICD-10-CM | POA: Diagnosis not present

## 2022-02-25 ENCOUNTER — Other Ambulatory Visit: Payer: Self-pay | Admitting: Orthopaedic Surgery

## 2022-02-25 ENCOUNTER — Ambulatory Visit
Admission: RE | Admit: 2022-02-25 | Discharge: 2022-02-25 | Disposition: A | Discharge status: Home / Self Care | Payer: BC Managed Care – PPO | Source: Ambulatory Visit | Attending: Orthopaedic Surgery | Admitting: Orthopaedic Surgery

## 2022-02-25 DIAGNOSIS — Z9889 Other specified postprocedural states: Secondary | ICD-10-CM | POA: Diagnosis not present

## 2022-02-25 DIAGNOSIS — M79621 Pain in right upper arm: Secondary | ICD-10-CM | POA: Diagnosis not present

## 2022-02-25 DIAGNOSIS — M79622 Pain in left upper arm: Secondary | ICD-10-CM | POA: Diagnosis not present

## 2022-03-18 DIAGNOSIS — Z9889 Other specified postprocedural states: Secondary | ICD-10-CM | POA: Diagnosis not present

## 2022-04-01 DIAGNOSIS — Z4789 Encounter for other orthopedic aftercare: Secondary | ICD-10-CM | POA: Diagnosis not present

## 2022-04-01 DIAGNOSIS — Q774 Achondroplasia: Secondary | ICD-10-CM | POA: Diagnosis not present

## 2022-04-15 ENCOUNTER — Other Ambulatory Visit: Payer: Self-pay | Admitting: Orthopedic Surgery

## 2022-04-15 ENCOUNTER — Ambulatory Visit
Admission: RE | Admit: 2022-04-15 | Discharge: 2022-04-15 | Disposition: A | Discharge status: Home / Self Care | Payer: BC Managed Care – PPO | Source: Ambulatory Visit | Attending: Orthopedic Surgery | Admitting: Orthopedic Surgery

## 2022-04-15 DIAGNOSIS — Z9889 Other specified postprocedural states: Secondary | ICD-10-CM

## 2022-04-15 DIAGNOSIS — Z4789 Encounter for other orthopedic aftercare: Secondary | ICD-10-CM | POA: Diagnosis not present

## 2022-05-06 ENCOUNTER — Ambulatory Visit
Admission: RE | Admit: 2022-05-06 | Discharge: 2022-05-06 | Disposition: A | Discharge status: Home / Self Care | Payer: BC Managed Care – PPO | Source: Ambulatory Visit | Attending: Orthopedic Surgery | Admitting: Orthopedic Surgery

## 2022-05-06 ENCOUNTER — Other Ambulatory Visit: Payer: Self-pay | Admitting: Orthopedic Surgery

## 2022-05-06 DIAGNOSIS — Z9889 Other specified postprocedural states: Secondary | ICD-10-CM

## 2022-05-20 ENCOUNTER — Ambulatory Visit
Admission: RE | Admit: 2022-05-20 | Discharge: 2022-05-20 | Disposition: A | Discharge status: Home / Self Care | Payer: BC Managed Care – PPO | Source: Ambulatory Visit | Attending: Orthopedic Surgery | Admitting: Orthopedic Surgery

## 2022-05-20 ENCOUNTER — Other Ambulatory Visit: Payer: Self-pay | Admitting: Orthopedic Surgery

## 2022-05-20 DIAGNOSIS — Z9889 Other specified postprocedural states: Secondary | ICD-10-CM

## 2022-05-20 DIAGNOSIS — Z4789 Encounter for other orthopedic aftercare: Secondary | ICD-10-CM | POA: Diagnosis not present

## 2022-06-02 ENCOUNTER — Other Ambulatory Visit: Payer: Self-pay | Admitting: Orthopedic Surgery

## 2022-06-02 ENCOUNTER — Ambulatory Visit
Admission: RE | Admit: 2022-06-02 | Discharge: 2022-06-02 | Disposition: A | Discharge status: Home / Self Care | Payer: BC Managed Care – PPO | Source: Ambulatory Visit | Attending: Orthopedic Surgery | Admitting: Orthopedic Surgery

## 2022-06-02 DIAGNOSIS — Z9889 Other specified postprocedural states: Secondary | ICD-10-CM | POA: Diagnosis not present

## 2022-07-08 DIAGNOSIS — Z9889 Other specified postprocedural states: Secondary | ICD-10-CM | POA: Diagnosis not present

## 2022-07-08 DIAGNOSIS — Q774 Achondroplasia: Secondary | ICD-10-CM | POA: Diagnosis not present

## 2022-08-25 ENCOUNTER — Ambulatory Visit
Admission: RE | Admit: 2022-08-25 | Discharge: 2022-08-25 | Disposition: A | Discharge status: Home / Self Care | Payer: BC Managed Care – PPO | Source: Ambulatory Visit | Attending: Orthopedic Surgery | Admitting: Orthopedic Surgery

## 2022-08-25 ENCOUNTER — Other Ambulatory Visit: Payer: Self-pay | Admitting: Orthopedic Surgery

## 2022-08-25 DIAGNOSIS — Z9889 Other specified postprocedural states: Secondary | ICD-10-CM | POA: Diagnosis not present

## 2022-09-07 DIAGNOSIS — Z9889 Other specified postprocedural states: Secondary | ICD-10-CM | POA: Diagnosis not present

## 2022-10-07 ENCOUNTER — Other Ambulatory Visit: Payer: Self-pay | Admitting: Orthopedic Surgery

## 2022-10-07 ENCOUNTER — Ambulatory Visit
Admission: RE | Admit: 2022-10-07 | Discharge: 2022-10-07 | Disposition: A | Discharge status: Home / Self Care | Payer: BC Managed Care – PPO | Source: Ambulatory Visit | Attending: Orthopedic Surgery | Admitting: Orthopedic Surgery

## 2022-10-07 DIAGNOSIS — Z9889 Other specified postprocedural states: Secondary | ICD-10-CM

## 2022-10-07 DIAGNOSIS — Z4889 Encounter for other specified surgical aftercare: Secondary | ICD-10-CM | POA: Diagnosis not present

## 2022-10-12 DIAGNOSIS — Z9889 Other specified postprocedural states: Secondary | ICD-10-CM | POA: Diagnosis not present

## 2022-10-12 DIAGNOSIS — Q774 Achondroplasia: Secondary | ICD-10-CM | POA: Diagnosis not present

## 2022-11-18 ENCOUNTER — Other Ambulatory Visit: Payer: Self-pay | Admitting: Orthopaedic Surgery

## 2022-11-18 ENCOUNTER — Ambulatory Visit
Admission: RE | Admit: 2022-11-18 | Discharge: 2022-11-18 | Disposition: A | Discharge status: Home / Self Care | Payer: BC Managed Care – PPO | Source: Ambulatory Visit | Attending: Orthopaedic Surgery | Admitting: Orthopaedic Surgery

## 2022-11-18 DIAGNOSIS — Z9889 Other specified postprocedural states: Secondary | ICD-10-CM

## 2022-11-18 DIAGNOSIS — Z4789 Encounter for other orthopedic aftercare: Secondary | ICD-10-CM | POA: Diagnosis not present

## 2022-11-23 DIAGNOSIS — Q774 Achondroplasia: Secondary | ICD-10-CM | POA: Diagnosis not present

## 2022-11-23 DIAGNOSIS — M25551 Pain in right hip: Secondary | ICD-10-CM | POA: Diagnosis not present

## 2022-11-23 DIAGNOSIS — Z9889 Other specified postprocedural states: Secondary | ICD-10-CM | POA: Diagnosis not present

## 2023-01-17 ENCOUNTER — Other Ambulatory Visit (HOSPITAL_COMMUNITY)
Admission: RE | Admit: 2023-01-17 | Discharge: 2023-01-17 | Disposition: A | Discharge status: Home / Self Care | Payer: BC Managed Care – PPO | Source: Ambulatory Visit | Attending: Nurse Practitioner | Admitting: Nurse Practitioner

## 2023-01-17 ENCOUNTER — Ambulatory Visit (INDEPENDENT_AMBULATORY_CARE_PROVIDER_SITE_OTHER): Payer: BC Managed Care – PPO | Admitting: Nurse Practitioner

## 2023-01-17 ENCOUNTER — Encounter: Payer: Self-pay | Admitting: Nurse Practitioner

## 2023-01-17 VITALS — BP 90/60 | HR 68 | Temp 98.5°F | Ht <= 58 in | Wt 92.8 lb

## 2023-01-17 DIAGNOSIS — L2089 Other atopic dermatitis: Secondary | ICD-10-CM

## 2023-01-17 DIAGNOSIS — Q774 Achondroplasia: Secondary | ICD-10-CM

## 2023-01-17 DIAGNOSIS — Z01818 Encounter for other preprocedural examination: Secondary | ICD-10-CM | POA: Diagnosis not present

## 2023-01-17 DIAGNOSIS — Z0001 Encounter for general adult medical examination with abnormal findings: Secondary | ICD-10-CM | POA: Diagnosis not present

## 2023-01-17 DIAGNOSIS — L2082 Flexural eczema: Secondary | ICD-10-CM

## 2023-01-17 DIAGNOSIS — Z124 Encounter for screening for malignant neoplasm of cervix: Secondary | ICD-10-CM

## 2023-01-17 DIAGNOSIS — Z23 Encounter for immunization: Secondary | ICD-10-CM

## 2023-01-17 DIAGNOSIS — Z0000 Encounter for general adult medical examination without abnormal findings: Secondary | ICD-10-CM | POA: Diagnosis not present

## 2023-01-17 LAB — CBC WITH DIFFERENTIAL/PLATELET
Basophils Absolute: 0 10*3/uL (ref 0.0–0.1)
Basophils Relative: 0.6 % (ref 0.0–3.0)
Eosinophils Absolute: 0.1 10*3/uL (ref 0.0–0.7)
Eosinophils Relative: 1.9 % (ref 0.0–5.0)
HCT: 39.6 % (ref 36.0–46.0)
Hemoglobin: 13 g/dL (ref 12.0–15.0)
Lymphocytes Relative: 50.1 % — ABNORMAL HIGH (ref 12.0–46.0)
Lymphs Abs: 1.8 10*3/uL (ref 0.7–4.0)
MCHC: 32.9 g/dL (ref 30.0–36.0)
MCV: 93.5 fL (ref 78.0–100.0)
Monocytes Absolute: 0.2 10*3/uL (ref 0.1–1.0)
Monocytes Relative: 6.9 % (ref 3.0–12.0)
Neutro Abs: 1.4 10*3/uL (ref 1.4–7.7)
Neutrophils Relative %: 40.5 % — ABNORMAL LOW (ref 43.0–77.0)
Platelets: 329 10*3/uL (ref 150.0–400.0)
RBC: 4.23 Mil/uL (ref 3.87–5.11)
RDW: 12.8 % (ref 11.5–15.5)
WBC: 3.6 10*3/uL — ABNORMAL LOW (ref 4.0–10.5)

## 2023-01-17 LAB — SEDIMENTATION RATE: Sed Rate: 19 mm/h (ref 0–20)

## 2023-01-17 LAB — POCT URINE PREGNANCY: Preg Test, Ur: NEGATIVE

## 2023-01-17 LAB — PROTIME-INR
INR: 1 {ratio} (ref 0.8–1.0)
Prothrombin Time: 10.9 s (ref 9.6–13.1)

## 2023-01-17 MED ORDER — HYDROCORTISONE 2.5 % EX OINT: TOPICAL_OINTMENT | Freq: Two times a day (BID) | CUTANEOUS | 1 refills | Status: DC

## 2023-01-17 NOTE — Assessment & Plan Note (Signed)
She needs pre-op for Bilateral femoral hardware removal, bilateral humeral ex-fix removal, right humeral non-union repair and bone grafting with nail insertion. She denies any pain or fever No redness, swelling or drainage noted at fixture insertion site.  Preop labs ordered

## 2023-01-17 NOTE — Progress Notes (Signed)
Complete physical exam  Patient: Andrea Harrison   DOB: 1996-03-08   27 y.o. Female  MRN: [id number] Visit Date: 01/17/2023  Subjective:    Chief Complaint  Patient presents with   Pre-op Exam   Andrea Harrison is a 27 y.o. female who presents today for a complete physical exam and pre-op clearance. She needs pre-op for Bilateral femoral hardware removal, bilateral humeral ex-fix removal, right humeral non-union repair and bone grafting with nail insertion. She reports consuming a general diet.  Limited exercise due to limb extension surgery  She generally feels well. She reports sleeping well. She does have additional problems to discuss today.  Vision:Yes Dental:No STD Screen:No  BP Readings from Last 3 Encounters:  01/17/23 90/60  12/21/21 (!) 106/58  09/08/21 110/60   Wt Readings from Last 3 Encounters:  01/17/23 92 lb 12.8 oz (42.1 kg)  12/21/21 93 lb (42.2 kg)  09/08/21 93 lb 12.8 oz (42.5 kg)   Most recent fall risk assessment:    01/17/2023   11:01 AM  Fall Risk   Falls in the past year? 0  Number falls in past yr: 0  Injury with Fall? 0  Risk for fall due to : No Fall Risks  Follow up Falls prevention discussed   Depression screen:Yes - No Depression Most recent depression screenings:    01/17/2023   11:01 AM 12/21/2021   11:32 AM  PHQ 2/9 Scores  PHQ - 2 Score 0 0  PHQ- 9 Score  0   HPI  Atopic dermatitis Needs refill on hydrocortisone  Achondroplasia syndrome She needs pre-op for Bilateral femoral hardware removal, bilateral humeral ex-fix removal, right humeral non-union repair and bone grafting with nail insertion. She denies any pain or fever No redness, swelling or drainage noted at fixture insertion site.  Preop labs ordered  No past medical history on file. Past Surgical History:  Procedure Laterality Date   LEG SURGERY     WISDOM TOOTH EXTRACTION     Social History   Socioeconomic History   Marital status: Single    Spouse name: Not on file    Number of children: Not on file   Years of education: Not on file   Highest education level: Not on file  Occupational History   Occupation: student  Tobacco Use   Smoking status: Never   Smokeless tobacco: Never  Vaping Use   Vaping status: Never Used  Substance and Sexual Activity   Alcohol use: Yes    Comment: Occas   Drug use: Never   Sexual activity: Yes    Partners: Male  Other Topics Concern   Not on file  Social History Narrative   Not on file   Social Determinants of Health   Financial Resource Strain: Not on file  Food Insecurity: Not on file  Transportation Needs: Not on file  Physical Activity: Insufficiently Active (01/16/2023)   Exercise Vital Sign    Days of Exercise per Week: 2 days    Minutes of Exercise per Session: 60 min  Stress: No Stress Concern Present (01/16/2023)   [employer] of Occupational Health - Occupational Stress Questionnaire    Feeling of Stress : Not at all  Social Connections: Not on file  Intimate Partner Violence: Not on file   Family Status  Relation Name Status   Mother  Alive   Father  Alive   Sister  Alive   Brother  Alive       [place] in heart at birth  surgically repaired   MGM  Alive   MGF  Alive   PGM  Alive   PGF  Deceased  No partnership data on file   No family history on file. No Known Allergies  Patient Care Team: Om Lizotte, Bonna Gains, NP as PCP - General (Internal Medicine)   Medications: Outpatient Medications Prior to Visit  Medication Sig   ACETAMINOPHEN EXTRA STRENGTH 500 MG tablet Take 1,000 mg by mouth as needed.   [DISCONTINUED] hydrocortisone 2.5 % ointment Apply topically 2 (two) times daily. (Patient not taking: Reported on 12/21/2021)   No facility-administered medications prior to visit.    Review of Systems      Objective:  BP 90/60 (BP Location: Left Arm, Patient Position: Sitting, Cuff Size: Small)   Pulse 68   Temp 98.5 F (36.9 C) (Temporal)   Ht 4' 3.5" (1.308 m)   Wt 92  lb 12.8 oz (42.1 kg)   LMP 01/15/2023 (Exact Date)   SpO2 97%   BMI 24.60 kg/m     Physical Exam Vitals and nursing note reviewed. Exam conducted with a chaperone present.  Constitutional:      General: She is not in acute distress. HENT:     Right Ear: Tympanic membrane, ear canal and external ear normal.     Left Ear: Tympanic membrane, ear canal and external ear normal.     Nose: Nose normal.  Eyes:     Extraocular Movements: Extraocular movements intact.     Conjunctiva/sclera: Conjunctivae normal.     Pupils: Pupils are equal, round, and reactive to light.  Neck:     Thyroid: No thyroid mass, thyromegaly or thyroid tenderness.  Cardiovascular:     Rate and Rhythm: Normal rate and regular rhythm.     Pulses: Normal pulses.     Heart sounds: Normal heart sounds.  Pulmonary:     Effort: Pulmonary effort is normal.     Breath sounds: Normal breath sounds.  Chest:  Breasts:    Breasts are symmetrical.     Right: Normal.     Left: Normal.  Abdominal:     General: Bowel sounds are normal.     Palpations: Abdomen is soft.     Hernia: There is no hernia in the left inguinal area or right inguinal area.  Genitourinary:    General: Normal vulva.     Exam position: Lithotomy position.     Labia:        Right: No rash, tenderness or lesion.        Left: No rash, tenderness or lesion.      Urethra: No prolapse or urethral pain.     Vagina: Normal.     Cervix: Normal.     Uterus: Normal.      Adnexa: Right adnexa normal and left adnexa normal.  Musculoskeletal:        General: Normal range of motion.     Right shoulder: Normal.     Left shoulder: Normal.     Right elbow: Normal.     Left elbow: Normal.     Right forearm: Normal.     Left forearm: Normal.     Right wrist: Normal.     Left wrist: Normal.     Right hand: Normal.     Left hand: Normal.     Cervical back: Normal range of motion and neck supple.     Right hip: Normal.     Left hip: Normal.     Right  upper leg: Normal.     Left upper leg: Normal.     Right knee: Normal.     Left knee: Normal.     Right lower leg: Normal. No edema.     Left lower leg: Normal. No edema.     Comments: External fixture in bilateral humerus  Lymphadenopathy:     Cervical: No cervical adenopathy.     Upper Body:     Right upper body: No supraclavicular, axillary or pectoral adenopathy.     Left upper body: No supraclavicular, axillary or pectoral adenopathy.     Lower Body: No right inguinal adenopathy. No left inguinal adenopathy.  Skin:    General: Skin is warm and dry.  Neurological:     Mental Status: She is alert and oriented to person, place, and time.     Cranial Nerves: No cranial nerve deficit.  Psychiatric:        Mood and Affect: Mood normal.        Behavior: Behavior normal.        Thought Content: Thought content normal.     Results for orders placed or performed in visit on 01/17/23  POCT urine pregnancy  Result Value Ref Range   Preg Test, Ur Negative Negative      Assessment & Plan:    Routine Health Maintenance and Physical Exam  Immunization History  Administered Date(s) Administered   DTaP 08/07/1997, 09/19/1997, 12/02/1997, 09/28/1998, 10/02/2002   HIB (PRP-OMP) 08/07/1997, 09/19/1997, 12/02/1997, 09/28/1998   Hepatitis A 11/03/2008   Hepatitis B Apr [birth year], 1997, 08/07/1997, 12/02/1997   IPV 08/07/1997, 09/19/1997, 06/12/1998, 09/28/1998, 10/02/2002   MMR 06/12/1998, 10/02/2002   Meningococcal Conjugate 11/03/2008   PPD Test 04/14/2020   Tdap 11/03/2008, 01/17/2023   Varicella 06/12/1998, 11/03/2008   Health Maintenance  Topic Date Due   Cervical Cancer Screening (Pap smear)  10/30/2021   INFLUENZA VACCINE  07/10/2023 (Originally 11/10/2022)   COVID-19 Vaccine (1 - 2023-24 season) 07/18/2023 (Originally 12/11/2022)   HPV VACCINES (1 - 3-dose series) 01/17/2024 (Originally 06/03/2012)   DTaP/Tdap/Td (8 - Td or Tdap) 01/16/2033   Hepatitis C Screening  Completed   HIV  Screening  Completed   Discussed health benefits of physical activity, and encouraged her to engage in regular exercise appropriate for her age and condition.  Problem List Items Addressed This Visit     Achondroplasia syndrome    She needs pre-op for Bilateral femoral hardware removal, bilateral humeral ex-fix removal, right humeral non-union repair and bone grafting with nail insertion. She denies any pain or fever No redness, swelling or drainage noted at fixture insertion site.  Preop labs ordered      Relevant Orders   C-reactive protein   Sedimentation rate   Protime-INR   APTT   CBC with Differential/Platelet   Comprehensive metabolic panel   Urinalysis w microscopic + reflex cultur   Atopic dermatitis    Needs refill on hydrocortisone      Other Visit Diagnoses     Encounter for preventative adult health care exam with abnormal findings    -  Primary   Relevant Orders   CBC with Differential/Platelet   Comprehensive metabolic panel   Preoperative clearance       Relevant Orders   C-reactive protein   Sedimentation rate   Protime-INR   APTT   CBC with Differential/Platelet   Comprehensive metabolic panel   Urinalysis w microscopic + reflex cultur   POCT urine pregnancy (Completed)   Immunization due  Relevant Orders   Tdap vaccine greater than or equal to 7yo IM (Completed)   Encounter for Papanicolaou smear for cervical cancer screening       Relevant Orders   Cytology - PAP   Flexural eczema       Relevant Medications   hydrocortisone 2.5 % ointment      Return in about 1 year (around 01/17/2024) for CPE (fasting).     Alysia Penna, NP

## 2023-01-17 NOTE — Assessment & Plan Note (Signed)
Needs refill on hydrocortisone.

## 2023-01-17 NOTE — Patient Instructions (Signed)
Go to lab maintain Heart healthy diet and daily exercise. Maintain current medications.  Preventive Care 73-25 Years Old, Female Preventive care refers to lifestyle choices and visits with your health care provider that can promote health and wellness. Preventive care visits are also called wellness exams. What can I expect for my preventive care visit? Counseling During your preventive care visit, your health care provider may ask about your: Medical history, including: Past medical problems. Family medical history. Pregnancy history. Current health, including: Menstrual cycle. Method of birth control. Emotional well-being. Home life and relationship well-being. Sexual activity and sexual health. Lifestyle, including: Alcohol, nicotine or tobacco, and drug use. Access to firearms. Diet, exercise, and sleep habits. Work and work Astronomer. Sunscreen use. Safety issues such as seatbelt and bike helmet use. Physical exam Your health care provider may check your: Height and weight. These may be used to calculate your BMI (body mass index). BMI is a measurement that tells if you are at a healthy weight. Waist circumference. This measures the distance around your waistline. This measurement also tells if you are at a healthy weight and may help predict your risk of certain diseases, such as type 2 diabetes and high blood pressure. Heart rate and blood pressure. Body temperature. Skin for abnormal spots. What immunizations do I need?  Vaccines are usually given at various ages, according to a schedule. Your health care provider will recommend vaccines for you based on your age, medical history, and lifestyle or other factors, such as travel or where you work. What tests do I need? Screening Your health care provider may recommend screening tests for certain conditions. This may include: Pelvic exam and Pap test. Lipid and cholesterol levels. Diabetes screening. This is done by  checking your blood sugar (glucose) after you have not eaten for a while (fasting). Hepatitis B test. Hepatitis C test. HIV (human immunodeficiency virus) test. STI (sexually transmitted infection) testing, if you are at risk. BRCA-related cancer screening. This may be done if you have a family history of breast, ovarian, tubal, or peritoneal cancers. Talk with your health care provider about your test results, treatment options, and if necessary, the need for more tests. Follow these instructions at home: Eating and drinking  Eat a healthy diet that includes fresh fruits and vegetables, whole grains, lean protein, and low-fat dairy products. Take vitamin and mineral supplements as recommended by your health care provider. Do not drink alcohol if: Your health care provider tells you not to drink. You are pregnant, may be pregnant, or are planning to become pregnant. If you drink alcohol: Limit how much you have to 0-1 drink a day. Know how much alcohol is in your drink. In the [place] one drink equals one 12 oz bottle of beer (355 mL), one 5 oz glass of wine (148 mL), or one 1 oz glass of hard liquor (44 mL). Lifestyle Brush your teeth every morning and night with fluoride toothpaste. Floss one time each day. Exercise for at least 30 minutes 5 or more days each week. Do not use any products that contain nicotine or tobacco. These products include cigarettes, chewing tobacco, and vaping devices, such as e-cigarettes. If you need help quitting, ask your health care provider. Do not use drugs. If you are sexually active, practice safe sex. Use a condom or other form of protection to prevent STIs. If you do not wish to become pregnant, use a form of birth control. If you plan to become pregnant, see your health care  provider for a prepregnancy visit. Find healthy ways to manage stress, such as: Meditation, yoga, or listening to music. Journaling. Talking to a trusted person. Spending time  with friends and family. Minimize exposure to UV radiation to reduce your risk of skin cancer. Safety Always wear your seat belt while driving or riding in a vehicle. Do not drive: If you have been drinking alcohol. Do not ride with someone who has been drinking. If you have been using any mind-altering substances or drugs. While texting. When you are tired or distracted. Wear a helmet and other protective equipment during sports activities. If you have firearms in your house, make sure you follow all gun safety procedures. Seek help if you have been physically or sexually abused. What's next? Go to your health care provider once a year for an annual wellness visit. Ask your health care provider how often you should have your eyes and teeth checked. Stay up to date on all vaccines. This information is not intended to replace advice given to you by your health care provider. Make sure you discuss any questions you have with your health care provider. Document Revised: 09/23/2020 Document Reviewed: 09/23/2020 Elsevier Patient Education  2024 [employer].

## 2023-01-18 ENCOUNTER — Encounter: Payer: Self-pay | Admitting: Nurse Practitioner

## 2023-01-18 LAB — URINALYSIS W MICROSCOPIC + REFLEX CULTURE
Bacteria, UA: NONE SEEN /[HPF]
Bilirubin Urine: NEGATIVE
Glucose, UA: NEGATIVE
Hyaline Cast: NONE SEEN /[LPF]
Ketones, ur: NEGATIVE
Leukocyte Esterase: NEGATIVE
Nitrites, Initial: NEGATIVE
Protein, ur: NEGATIVE
RBC / HPF: NONE SEEN /[HPF] (ref 0–2)
Specific Gravity, Urine: 1.012 (ref 1.001–1.035)
WBC, UA: NONE SEEN /[HPF] (ref 0–5)
pH: 6.5 (ref 5.0–8.0)

## 2023-01-18 LAB — COMPREHENSIVE METABOLIC PANEL
ALT: 7 U/L (ref 0–35)
AST: 13 U/L (ref 0–37)
Albumin: 4.2 g/dL (ref 3.5–5.2)
Alkaline Phosphatase: 78 U/L (ref 39–117)
BUN: 11 mg/dL (ref 6–23)
CO2: 26 meq/L (ref 19–32)
Calcium: 9.4 mg/dL (ref 8.4–10.5)
Chloride: 103 meq/L (ref 96–112)
Creatinine, Ser: 0.5 mg/dL (ref 0.40–1.20)
GFR: 130.17 mL/min (ref >60.00)
Glucose, Bld: 95 mg/dL (ref 70–99)
Potassium: 3.9 meq/L (ref 3.5–5.1)
Sodium: 136 meq/L (ref 135–145)
Total Bilirubin: 0.4 mg/dL (ref 0.2–1.2)
Total Protein: 7 g/dL (ref 6.0–8.3)

## 2023-01-18 LAB — NO CULTURE INDICATED

## 2023-01-18 LAB — C-REACTIVE PROTEIN: CRP: <1 mg/dL (ref 0.5–20.0)

## 2023-01-19 LAB — CYTOLOGY - PAP
Comment: NEGATIVE
Diagnosis: NEGATIVE
High risk HPV: NEGATIVE

## 2023-01-30 DIAGNOSIS — Q774 Achondroplasia: Secondary | ICD-10-CM | POA: Diagnosis not present

## 2023-01-30 DIAGNOSIS — Z9889 Other specified postprocedural states: Secondary | ICD-10-CM | POA: Diagnosis not present

## 2023-01-31 DIAGNOSIS — Z472 Encounter for removal of internal fixation device: Secondary | ICD-10-CM | POA: Diagnosis not present

## 2023-01-31 DIAGNOSIS — T8484XA Pain due to internal orthopedic prosthetic devices, implants and grafts, initial encounter: Secondary | ICD-10-CM | POA: Diagnosis not present

## 2023-01-31 DIAGNOSIS — Q774 Achondroplasia: Secondary | ICD-10-CM | POA: Diagnosis not present

## 2023-01-31 DIAGNOSIS — Z4789 Encounter for other orthopedic aftercare: Secondary | ICD-10-CM | POA: Diagnosis not present

## 2023-01-31 DIAGNOSIS — Y831 Surgical operation with implant of artificial internal device as the cause of abnormal reaction of the patient, or of later complication, without mention of misadventure at the time of the procedure: Secondary | ICD-10-CM | POA: Diagnosis not present

## 2023-03-16 ENCOUNTER — Ambulatory Visit
Admission: RE | Admit: 2023-03-16 | Discharge: 2023-03-16 | Disposition: A | Discharge status: Home / Self Care | Payer: BC Managed Care – PPO | Source: Ambulatory Visit | Attending: Orthopaedic Surgery | Admitting: Orthopaedic Surgery

## 2023-03-16 ENCOUNTER — Other Ambulatory Visit: Payer: Self-pay | Admitting: Orthopaedic Surgery

## 2023-03-16 DIAGNOSIS — Z9889 Other specified postprocedural states: Secondary | ICD-10-CM

## 2023-03-16 DIAGNOSIS — Z4789 Encounter for other orthopedic aftercare: Secondary | ICD-10-CM | POA: Diagnosis not present

## 2023-04-16 IMAGING — CR DG FEMUR 2+V*R*
3 series · 3 of 3 positions shown · non-contrast
Comparison: None.

CLINICAL DATA: Right femoral pain

EXAM:
RIGHT FEMUR 2 VIEWS

[t femur proximal ap right]
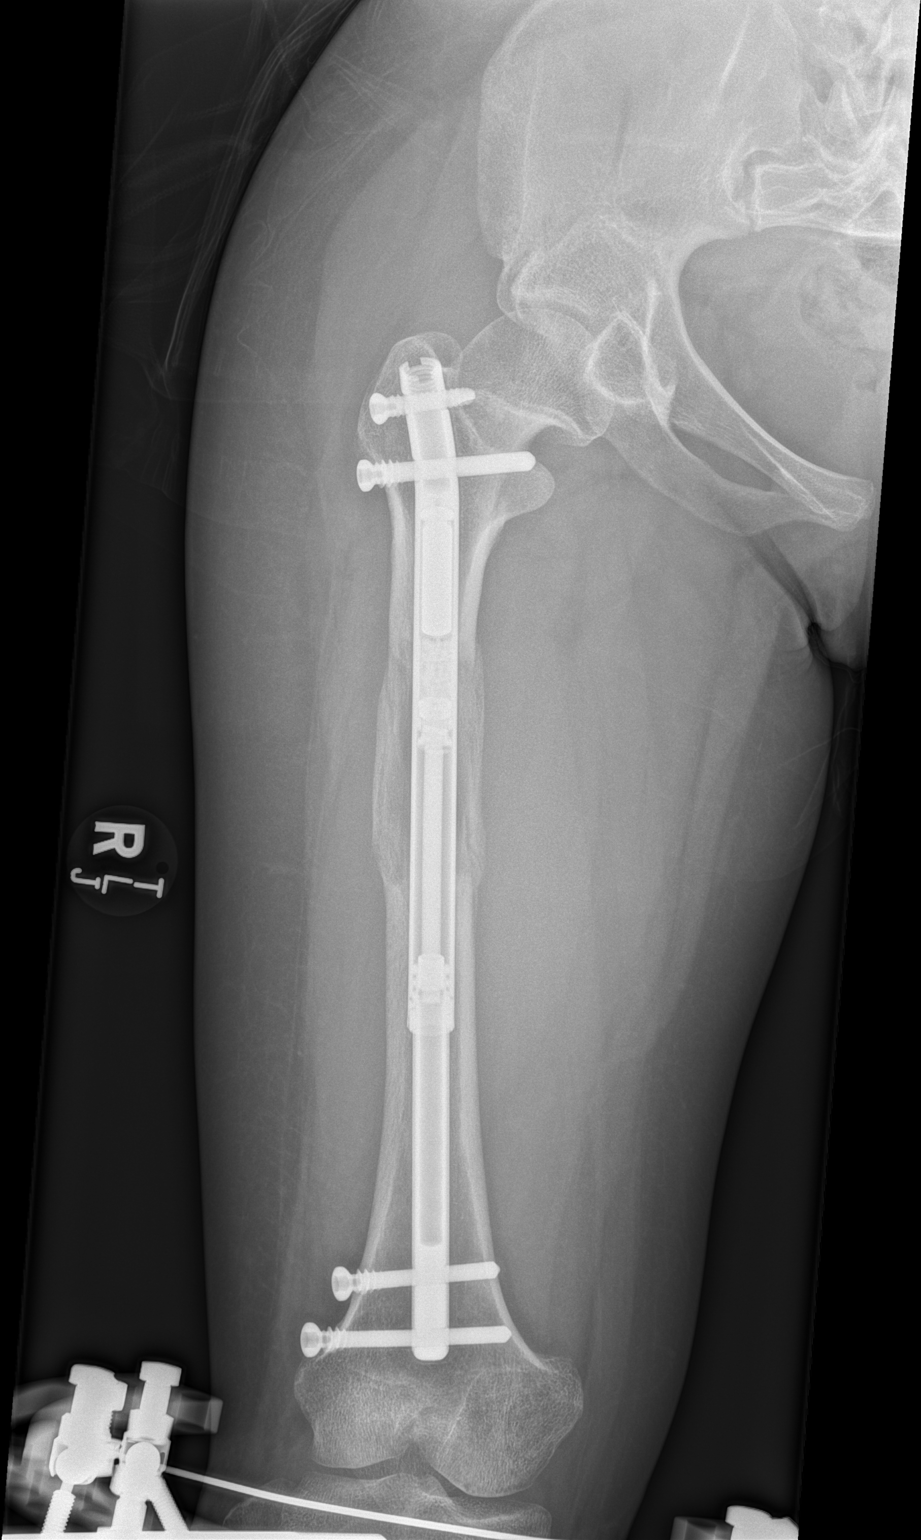

[t femur distal lat right (1 of 2)]
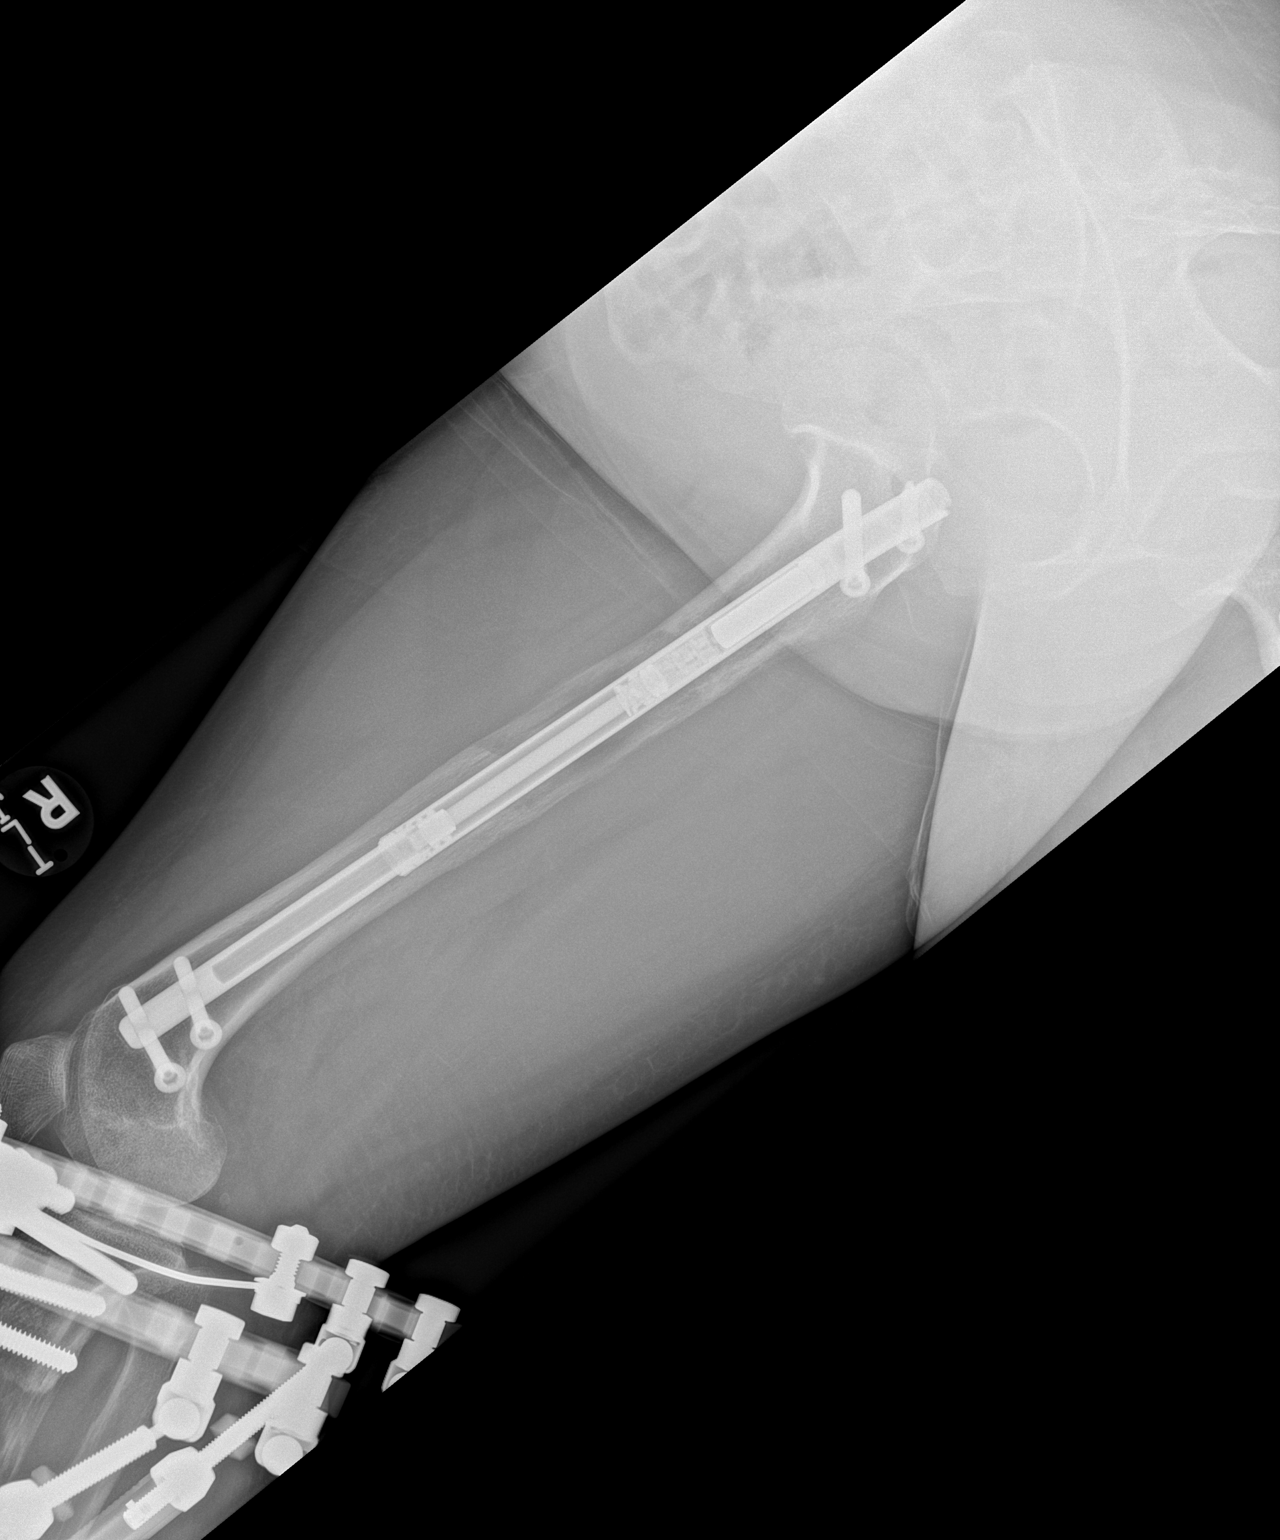

[t femur distal lat right (2 of 2)]
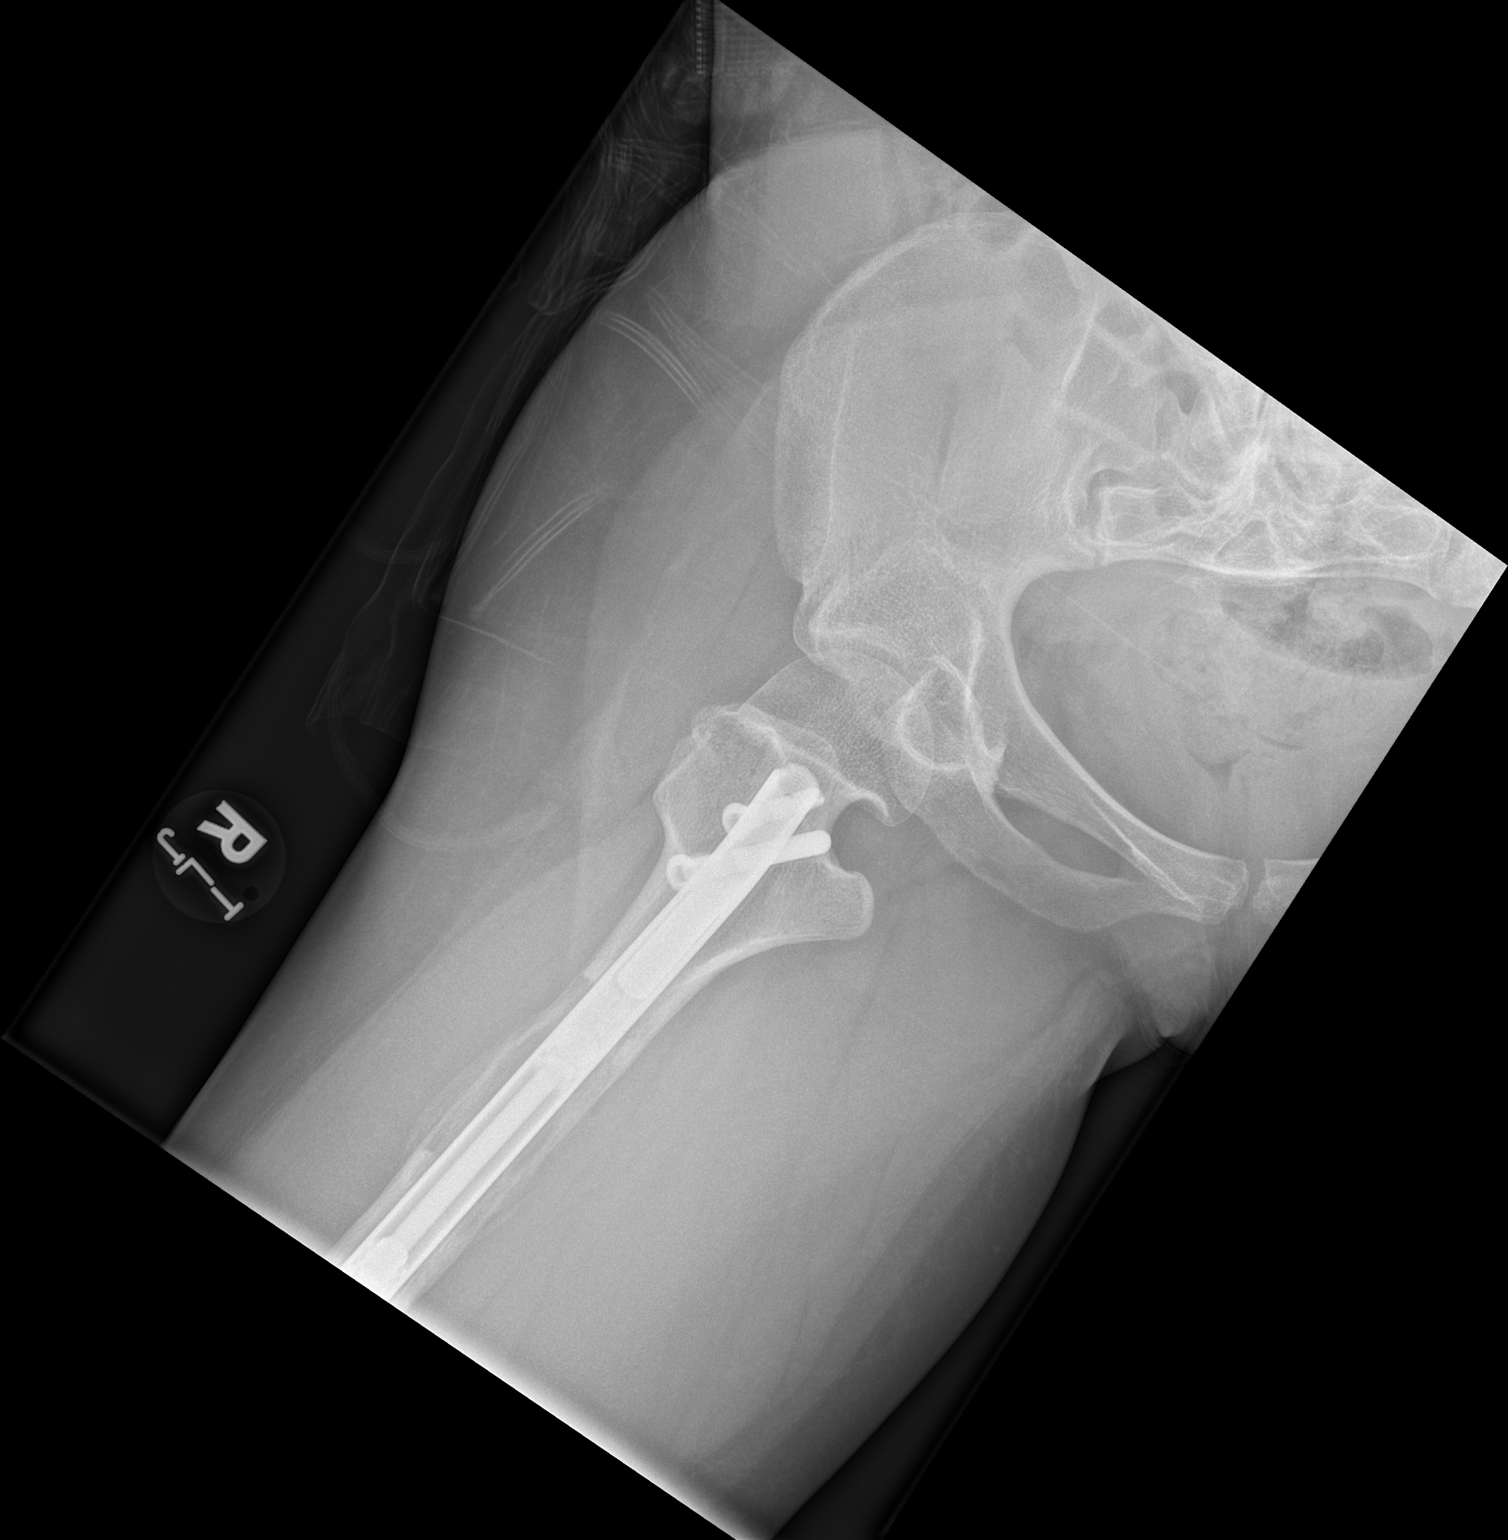

[3 of 3 positions shown; findings below may reference images not displayed]

FINDINGS: Congenital dysplasia of the right hip. Right femoral bone
lengthening procedure has been performed with a telescoping
intramedullary rod with proximal and distal interlocking screws.
There is bridging callus identified involving the proximal to mid
diaphysis of the right femur. No acute fracture or dislocation. Soft
tissues are unremarkable.
IMPRESSION: No acute fracture or dislocation.

Probable femoral bone lengthening procedure with bridging callus
identified within the mid diaphysis of the right femur.

Congenital right hip dysplasia.

## 2023-06-23 NOTE — Telephone Encounter (Signed)
 Surgical Clearance paperwork found in folder from PCP previous CMA. Sending to HIM to be scanned in Media.

## 2023-07-08 IMAGING — CR DG TIBIA/FIBULA 2V*L*
2 series · 2 of 2 positions shown · non-contrast
Comparison: Left tibia and fibula radiographs 05/27/2021

CLINICAL DATA: Status post osteotomy of left lower leg 12/31/2020.

EXAM:
LEFT TIBIA AND FIBULA - 2 VIEW

[x tib-fib ap left]
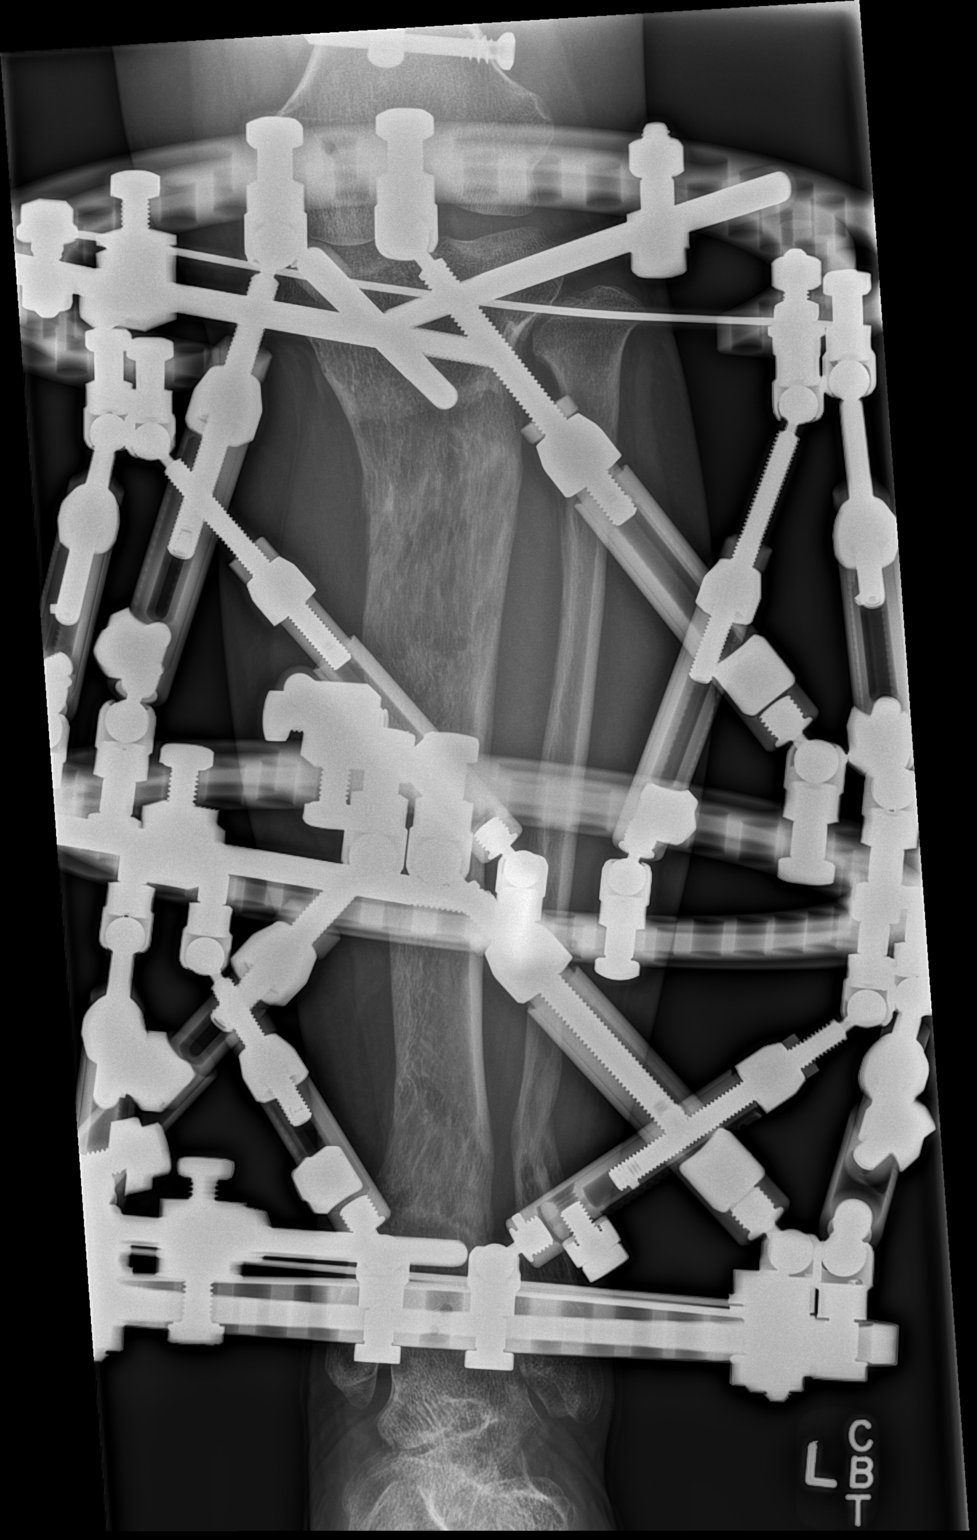

[x tib-fib lat left]
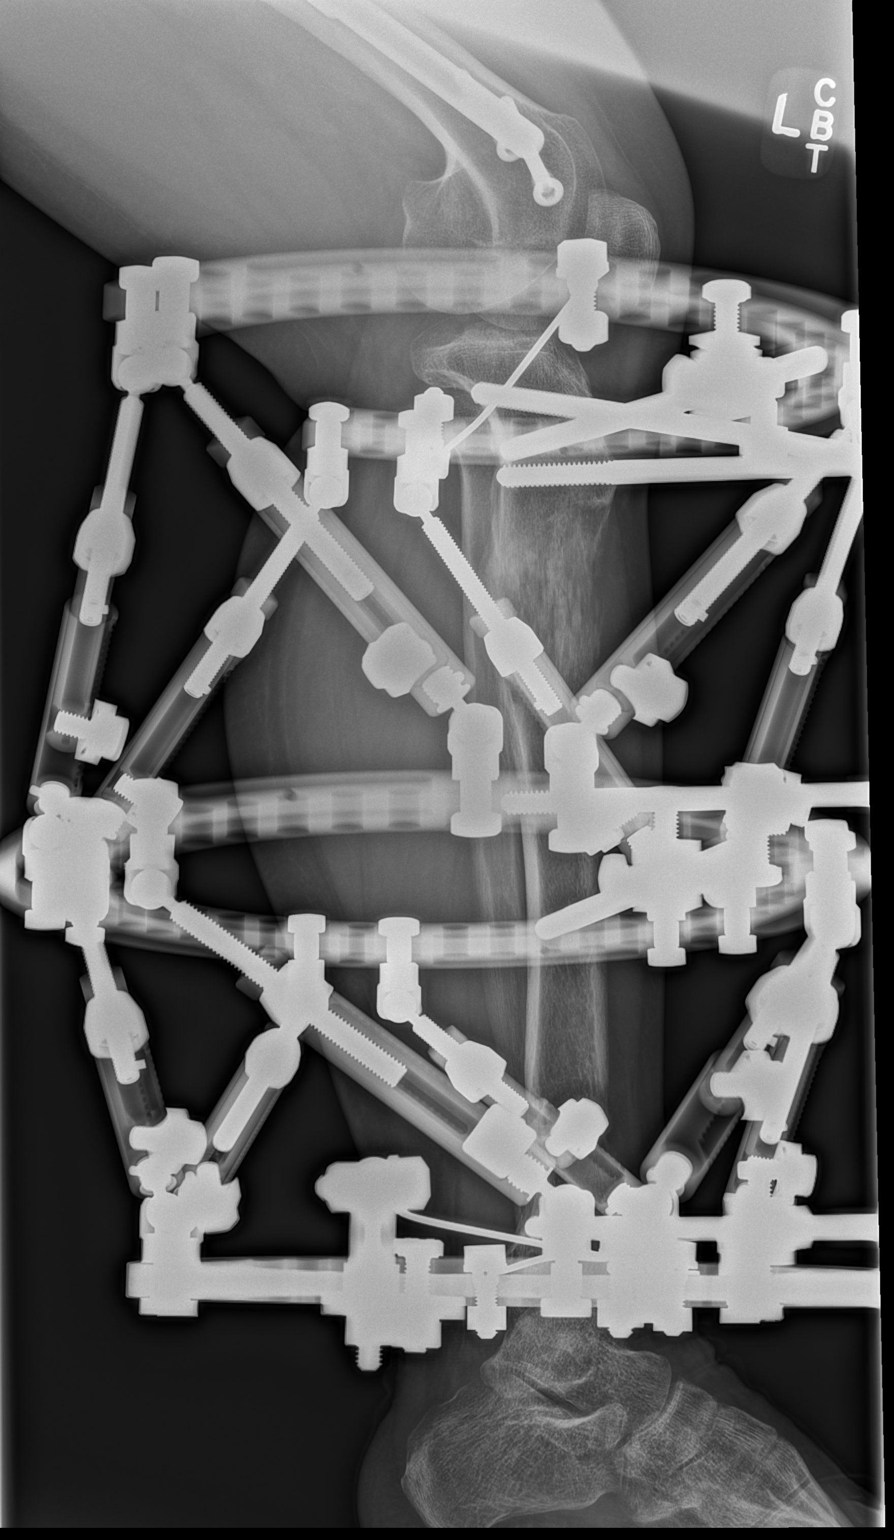

[2 of 2 positions shown; findings below may reference images not displayed]

FINDINGS: Redemonstration of Ilizarov frame that again obscures portions of
the tibia and fibula. There is mild progressive healing sclerosis in
the region of the proximal tibial metadiaphyseal osteotomy.

There again are probable osteotomies within the distal tibial
metadiaphysis and distal fibular diaphysis with mild progressive
healing sclerosis. Especially of the distal tibial region is
obscured by the overlying frame.

Likely disuse osteopenia.
IMPRESSION: Mild progressive healing sclerosis of the partially visualized
proximal distal tibial and distal fibular osteotomy sites.

## 2023-07-22 ENCOUNTER — Encounter (INDEPENDENT_AMBULATORY_CARE_PROVIDER_SITE_OTHER): Payer: Self-pay | Admitting: Nurse Practitioner

## 2023-07-22 DIAGNOSIS — B070 Plantar wart: Secondary | ICD-10-CM

## 2023-07-24 DIAGNOSIS — B070 Plantar wart: Secondary | ICD-10-CM

## 2023-07-24 MED ORDER — PODOFILOX 0.5 % EX SOLN: CUTANEOUS | 0 refills | Status: DC

## 2023-07-24 NOTE — Telephone Encounter (Signed)
Please see the MyChart message reply(ies) for my assessment and plan.  The patient gave consent for this Medical Advice Message and is aware that it may result in a bill to their insurance company as well as the possibility that this may result in a co-payment or deductible. They are an established patient, but are not seeking medical advice exclusively about a problem treated during an in person or video visit in the last 7 days. I did not recommend an in person or video visit within 7 days of my reply.  I spent a total of 10 minutes cumulative time within 7 days through MyChart messaging Lillith Mcneff, NP  

## 2023-09-15 ENCOUNTER — Encounter: Payer: Self-pay | Admitting: Nurse Practitioner

## 2023-09-15 DIAGNOSIS — B070 Plantar wart: Secondary | ICD-10-CM

## 2023-10-11 DIAGNOSIS — Z9889 Other specified postprocedural states: Secondary | ICD-10-CM | POA: Diagnosis not present

## 2023-10-11 DIAGNOSIS — Q774 Achondroplasia: Secondary | ICD-10-CM | POA: Diagnosis not present

## 2024-01-02 ENCOUNTER — Ambulatory Visit: Admitting: Nurse Practitioner

## 2024-01-02 ENCOUNTER — Encounter: Payer: Self-pay | Admitting: Nurse Practitioner

## 2024-01-02 VITALS — BP 98/62 | HR 70 | Temp 98.8°F | Ht <= 58 in | Wt 90.0 lb

## 2024-01-02 DIAGNOSIS — Q774 Achondroplasia: Secondary | ICD-10-CM | POA: Diagnosis not present

## 2024-01-02 DIAGNOSIS — B070 Plantar wart: Secondary | ICD-10-CM

## 2024-01-02 DIAGNOSIS — Z0001 Encounter for general adult medical examination with abnormal findings: Secondary | ICD-10-CM | POA: Diagnosis not present

## 2024-01-02 DIAGNOSIS — Z01818 Encounter for other preprocedural examination: Secondary | ICD-10-CM

## 2024-01-02 LAB — COMPREHENSIVE METABOLIC PANEL WITH GFR
ALT: 7 U/L (ref 0–35)
AST: 15 U/L (ref 0–37)
Albumin: 4.4 g/dL (ref 3.5–5.2)
Alkaline Phosphatase: 62 U/L (ref 39–117)
BUN: 7 mg/dL (ref 6–23)
CO2: 26 meq/L (ref 19–32)
Calcium: 9.5 mg/dL (ref 8.4–10.5)
Chloride: 101 meq/L (ref 96–112)
Creatinine, Ser: 0.46 mg/dL (ref 0.40–1.20)
GFR: 131.92 mL/min (ref >60.00)
Glucose, Bld: 93 mg/dL (ref 70–99)
Potassium: 3.8 meq/L (ref 3.5–5.1)
Sodium: 138 meq/L (ref 135–145)
Total Bilirubin: 0.5 mg/dL (ref 0.2–1.2)
Total Protein: 7.3 g/dL (ref 6.0–8.3)

## 2024-01-02 LAB — CBC WITH DIFFERENTIAL/PLATELET
Basophils Absolute: 0 K/uL (ref 0.0–0.1)
Basophils Relative: 1 % (ref 0.0–3.0)
Eosinophils Absolute: 0.1 K/uL (ref 0.0–0.7)
Eosinophils Relative: 2.4 % (ref 0.0–5.0)
HCT: 39.9 % (ref 36.0–46.0)
Hemoglobin: 13.4 g/dL (ref 12.0–15.0)
Lymphocytes Relative: 42.1 % (ref 12.0–46.0)
Lymphs Abs: 1.6 K/uL (ref 0.7–4.0)
MCHC: 33.5 g/dL (ref 30.0–36.0)
MCV: 93.3 fl (ref 78.0–100.0)
Monocytes Absolute: 0.3 K/uL (ref 0.1–1.0)
Monocytes Relative: 7.9 % (ref 3.0–12.0)
Neutro Abs: 1.7 K/uL (ref 1.4–7.7)
Neutrophils Relative %: 46.6 % (ref 43.0–77.0)
Platelets: 293 K/uL (ref 150.0–400.0)
RBC: 4.27 Mil/uL (ref 3.87–5.11)
RDW: 12.5 % (ref 11.5–15.5)
WBC: 3.7 K/uL — ABNORMAL LOW (ref 4.0–10.5)

## 2024-01-02 LAB — POCT URINE PREGNANCY: Preg Test, Ur: NEGATIVE

## 2024-01-02 LAB — PROTIME-INR
INR: 1 ratio (ref 0.8–1.0)
Prothrombin Time: 11 s (ref 9.6–13.1)

## 2024-01-02 LAB — APTT: aPTT: 98.1 s — ABNORMAL HIGH (ref 25.4–36.8)

## 2024-01-02 NOTE — Assessment & Plan Note (Signed)
 She needs pre-op for Bilateral femoral lengthening osteoplasty, iliotibial band fasciotomy, tibial lengthening osteoplasty, syndesmotic gastrocnemius recession on 01/16/2024 by Dr. Gladys Mari in [place] .   No fever, no recent acute illness.  Preop labs ordered

## 2024-01-02 NOTE — Assessment & Plan Note (Signed)
 Recurrent, on left middle and ring fingers No improvement with topical agents-compound W and podolix.  She agreed to histofreeze today Cryotherapy Obtained verbal consent from Lunda Cramp. Procedure was explained. Advised about possible need to repeat procedure in 1week. Advised about possible redness at site.She/He verbalized understanding. Plantar's wart on left middle and ring fingers, number of lesions:2 Method of Treatment: use Histofreezer to apply liquid nitrogen x 40seconds on each lesion. Well tolerated. Applied bandaid to site No complication reported.

## 2024-01-02 NOTE — Progress Notes (Signed)
 Complete physical exam  Patient: Andrea Harrison   DOB: November 21, 1995   28 y.o. Female  MRN: [id number] Visit Date: 01/02/2024  Subjective:    Chief Complaint  Patient presents with   Pre-op Exam    Pre-op surgery on legs scheduled for 01/16/24   Andrea Harrison is a 28 y.o. female who presents today for a complete physical exam. She reports consuming a general diet. No consistent exercise regimen She generally feels well. She reports sleeping well. She does have additional problems to discuss today.  Vision:Yes Dental:Yes STD Screen:No  BP Readings from Last 3 Encounters:  01/02/24 98/62  01/17/23 90/60  12/21/21 (!) 106/58   Wt Readings from Last 3 Encounters:  01/02/24 90 lb (40.8 kg)  01/17/23 92 lb 12.8 oz (42.1 kg)  12/21/21 93 lb (42.2 kg)   Most recent fall risk assessment:    01/17/2023   11:01 AM  Fall Risk   Falls in the past year? 0  Number falls in past yr: 0  Injury with Fall? 0  Risk for fall due to : No Fall Risks  Follow up Falls prevention discussed     Depression screen:Yes - No Depression Most recent depression screenings:    01/17/2023   11:01 AM 12/21/2021   11:32 AM  PHQ 2/9 Scores  PHQ - 2 Score 0 0  PHQ- 9 Score  0    HPI  Plantar warts Recurrent, on left middle and ring fingers No improvement with topical agents-compound W and podolix.  She agreed to histofreeze today Cryotherapy Obtained verbal consent from Andrea Harrison. Procedure was explained. Advised about possible need to repeat procedure in 1week. Advised about possible redness at site.She/He verbalized understanding. Plantar's wart on left middle and ring fingers, number of lesions:2 Method of Treatment: use Histofreezer to apply liquid nitrogen x 40seconds on each lesion. Well tolerated. Applied bandaid to site No complication reported.   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 Gladys Mari in [place] .   No fever, no recent acute illness.  Preop labs ordered  History reviewed. No pertinent past medical history. Past Surgical History:  Procedure Laterality Date   LEG SURGERY     WISDOM TOOTH EXTRACTION     Social History   Socioeconomic History   Marital status: Single    Spouse name: Not on file   Number of children: Not on file   Years of education: Not on file   Highest education level: Not on file  Occupational History   Occupation: student  Tobacco Use   Smoking status: Never   Smokeless tobacco: Never  Vaping Use   Vaping status: Never Used  Substance and Sexual Activity   Alcohol use: Yes    Comment: Occas   Drug use: Never   Sexual activity: Yes    Partners: Male  Other Topics Concern   Not on file  Social History Narrative   Not on file   Social Drivers of Health   Financial Resource Strain: Not on file  Food Insecurity: Not on file  Transportation Needs: Not on file  Physical Activity: Insufficiently Active (01/16/2023)   Exercise Vital Sign    Days of Exercise per Week: 2 days    Minutes of Exercise per Session: 60 min  Stress: No Stress Concern Present (01/16/2023)   [employer] of Occupational Health - Occupational Stress Questionnaire    Feeling of Stress :  Not at all  Social Connections: Not on file  Intimate Partner Violence: Not on file   Family Status  Relation Name Status   Mother  Alive   Father  Alive   Sister  Alive   Brother  Alive       Hole in heart at birth surgically repaired   MGM  Alive   MGF  Alive   PGM  Alive   PGF  Deceased  No partnership data on file   History reviewed. No pertinent family history. No Known Allergies  Patient Care Team: Talicia Sui, Roselie Rockford, NP as PCP - General (Internal Medicine)   Medications: Outpatient Medications Prior to Visit  Medication Sig   ACETAMINOPHEN EXTRA STRENGTH 500 MG tablet Take 1,000 mg  by mouth as needed.   [DISCONTINUED] hydrocortisone  2.5 % ointment Apply topically 2 (two) times daily. (Patient not taking: Reported on 01/02/2024)   [DISCONTINUED] podofilox  (CONDYLOX ) 0.5 % external solution 1application BID x 3days, repeat each week no more than 4weeks (Patient not taking: Reported on 01/02/2024)   No facility-administered medications prior to visit.    Review of Systems  Constitutional:  Negative for activity change, appetite change and unexpected weight change.  Respiratory: Negative.    Cardiovascular: Negative.   Gastrointestinal: Negative.   Endocrine: Negative for cold intolerance and heat intolerance.  Genitourinary: Negative.   Musculoskeletal: Negative.   Skin:  Positive for rash.  Neurological: Negative.   Hematological: Negative.   Psychiatric/Behavioral:  Negative for behavioral problems, decreased concentration, dysphoric mood, hallucinations, self-injury, sleep disturbance and suicidal ideas. The patient is not nervous/anxious.         Objective:  BP 98/62 (BP Location: Left Arm, Patient Position: Sitting, Cuff Size: Normal)   Pulse 70   Temp 98.8 F (37.1 C) (Oral)   Ht 4' 3 (1.295 m)   Wt 90 lb (40.8 kg)   LMP 12/28/2023   SpO2 100%   BMI 24.33 kg/m     Physical Exam Vitals and nursing note reviewed.  Constitutional:      General: She is not in acute distress. HENT:     Right Ear: Tympanic membrane, ear canal and external ear normal.     Left Ear: Tympanic membrane, ear canal and external ear normal.     Nose: Nose normal.  Eyes:     Extraocular Movements: Extraocular movements intact.     Conjunctiva/sclera: Conjunctivae normal.     Pupils: Pupils are equal, round, and reactive to light.  Neck:     Thyroid: No thyroid mass, thyromegaly or thyroid tenderness.  Cardiovascular:     Rate and Rhythm: Normal rate and regular rhythm.     Pulses: Normal pulses.     Heart sounds: Normal heart sounds.  Pulmonary:     Effort: Pulmonary  effort is normal.     Breath sounds: Normal breath sounds.  Abdominal:     General: Bowel sounds are normal. There is no distension.     Palpations: Abdomen is soft.     Tenderness: There is no abdominal tenderness.  Musculoskeletal:        General: Normal range of motion.     Cervical back: Normal range of motion and neck supple.     Right lower leg: No edema.     Left lower leg: No edema.  Lymphadenopathy:     Cervical: No cervical adenopathy.  Skin:    General: Skin is warm and dry.  Neurological:     Mental Status: She  is alert and oriented to person, place, and time.     Cranial Nerves: No cranial nerve deficit.  Psychiatric:        Mood and Affect: Mood normal.        Behavior: Behavior normal.        Thought Content: Thought content normal.      Results for orders placed or performed in visit on 01/02/24  Protime-INR  Result Value Ref Range   INR 1.0 0.8 - 1.0 ratio   Prothrombin Time 11.0 9.6 - 13.1 sec  POCT urine pregnancy  Result Value Ref Range   Preg Test, Ur Negative Negative      Assessment & Plan:    Routine Health Maintenance and Physical Exam  Immunization History  Administered Date(s) Administered   DTaP 08/07/1997, 09/19/1997, 12/02/1997, 09/28/1998, 10/02/2002   Dtap, Unspecified 08/07/1997, 09/19/1997, 12/02/1997, 09/28/1998, 10/02/2002   HIB (PRP-OMP) 08/07/1997, 09/19/1997, 12/02/1997, 09/28/1998   HIB, Unspecified 08/07/1997, 09/19/1997, 12/02/1997, 09/28/1998   Hep B, Unspecified 01-01-1998, 08/07/1997, 12/02/1997   Hepatitis A 11/03/2008   Hepatitis A, Ped/Adol-2 Dose 11/03/2008   Hepatitis B 12/26/1997, 08/07/1997, 12/02/1997   IPV 08/07/1997, 09/19/1997, 06/12/1998, 09/28/1998, 10/02/2002   MMR 06/12/1998, 10/02/2002   Meningococcal Conjugate 11/03/2008   PFIZER Comirnaty(Gray Top)Covid-19 Tri-Sucrose Vaccine 07/03/2020   PFIZER(Purple Top)SARS-COV-2 Vaccination 07/01/2019, 07/29/2019   PPD Test 04/14/2020   Polio, Unspecified  08/07/1997, 09/19/1997, 06/12/1998, 09/28/1998, 10/02/2002   Tdap 11/03/2008, 01/17/2023   Varicella 06/12/1998, 11/03/2008    Health Maintenance  Topic Date Due   HPV VACCINES (1 - 3-dose series) 01/17/2024 (Originally 06/03/2012)   COVID-19 Vaccine (4 - 2025-26 season) 01/18/2024 (Originally 12/11/2023)   Influenza Vaccine  07/09/2024 (Originally 11/10/2023)   Cervical Cancer Screening (Pap smear)  01/16/2026   DTaP/Tdap/Td (8 - Td or Tdap) 01/16/2033   Hepatitis C Screening  Completed   HIV Screening  Completed   Pneumococcal Vaccine  Aged Out   Meningococcal B Vaccine  Aged Out   Hepatitis B Vaccines 19-59 Average Risk  Discontinued    Discussed health benefits of physical activity, and encouraged her to engage in regular exercise appropriate for her age and condition.  Problem List Items Addressed This Visit     Achondroplasia syndrome   She needs pre-op for Bilateral femoral lengthening osteoplasty, iliotibial band fasciotomy, tibial lengthening osteoplasty, syndesmotic gastrocnemius recession on 01/16/2024 by Dr. Gladys Assayag in [place] .   No fever, no recent acute illness.  Preop labs ordered      Plantar warts   Recurrent, on left middle and ring fingers No improvement with topical agents-compound W and podolix.  She agreed to histofreeze today Cryotherapy Obtained verbal consent from Andrea Harrison. Procedure was explained. Advised about possible need to repeat procedure in 1week. Advised about possible redness at site.She/He verbalized understanding. Plantar's wart on left middle and ring fingers, number of lesions:2 Method of Treatment: use Histofreezer to apply liquid nitrogen x 40seconds on each lesion. Well tolerated. Applied bandaid to site No complication reported.       Other Visit Diagnoses       Encounter for preventative adult health care exam with abnormal findings    -  Primary     Preoperative clearance       Relevant Orders   CBC with  Differential/Platelet   Comprehensive metabolic panel with GFR   Protime-INR (Completed)   PTT   Urinalysis w microscopic + reflex cultur   POCT urine pregnancy (Completed)      Return in about  6 months (around 07/01/2024) for CPE (fasting).     Roselie Mood, NP

## 2024-01-02 NOTE — Patient Instructions (Signed)
 Go to lab Schedule another office visit if wart does not resolve

## 2024-01-03 ENCOUNTER — Ambulatory Visit: Payer: Self-pay | Admitting: Nurse Practitioner

## 2024-01-03 LAB — URINALYSIS W MICROSCOPIC + REFLEX CULTURE
Bacteria, UA: NONE SEEN /HPF
Bilirubin Urine: NEGATIVE
Glucose, UA: NEGATIVE
Hgb urine dipstick: NEGATIVE
Hyaline Cast: NONE SEEN /LPF
Ketones, ur: NEGATIVE
Leukocyte Esterase: NEGATIVE
Nitrites, Initial: NEGATIVE
Protein, ur: NEGATIVE
RBC / HPF: NONE SEEN /HPF (ref 0–2)
Specific Gravity, Urine: 1.007 (ref 1.001–1.035)
Squamous Epithelial / HPF: NONE SEEN /HPF (ref <=5)
WBC, UA: NONE SEEN /HPF (ref 0–5)
pH: 6.5 (ref 5.0–8.0)

## 2024-01-03 LAB — NO CULTURE INDICATED

## 2024-01-15 DIAGNOSIS — Q774 Achondroplasia: Secondary | ICD-10-CM | POA: Diagnosis not present

## 2024-01-15 DIAGNOSIS — Z01818 Encounter for other preprocedural examination: Secondary | ICD-10-CM | POA: Diagnosis not present

## 2024-01-16 DIAGNOSIS — Q774 Achondroplasia: Secondary | ICD-10-CM | POA: Diagnosis not present

## 2024-01-16 DIAGNOSIS — Z7901 Long term (current) use of anticoagulants: Secondary | ICD-10-CM | POA: Diagnosis not present

## 2024-01-16 DIAGNOSIS — Z9889 Other specified postprocedural states: Secondary | ICD-10-CM | POA: Diagnosis not present

## 2024-01-17 DIAGNOSIS — Q774 Achondroplasia: Secondary | ICD-10-CM | POA: Diagnosis not present

## 2024-01-17 DIAGNOSIS — Z7901 Long term (current) use of anticoagulants: Secondary | ICD-10-CM | POA: Diagnosis not present

## 2024-01-19 DIAGNOSIS — M6281 Muscle weakness (generalized): Secondary | ICD-10-CM | POA: Diagnosis not present

## 2024-01-19 DIAGNOSIS — Q774 Achondroplasia: Secondary | ICD-10-CM | POA: Diagnosis not present

## 2024-01-19 DIAGNOSIS — Z9889 Other specified postprocedural states: Secondary | ICD-10-CM | POA: Diagnosis not present

## 2024-01-19 DIAGNOSIS — M25651 Stiffness of right hip, not elsewhere classified: Secondary | ICD-10-CM | POA: Diagnosis not present

## 2024-01-19 DIAGNOSIS — R262 Difficulty in walking, not elsewhere classified: Secondary | ICD-10-CM | POA: Diagnosis not present

## 2024-01-19 DIAGNOSIS — M25661 Stiffness of right knee, not elsewhere classified: Secondary | ICD-10-CM | POA: Diagnosis not present

## 2024-01-24 DIAGNOSIS — M25651 Stiffness of right hip, not elsewhere classified: Secondary | ICD-10-CM | POA: Diagnosis not present

## 2024-01-24 DIAGNOSIS — Q774 Achondroplasia: Secondary | ICD-10-CM | POA: Diagnosis not present

## 2024-01-24 DIAGNOSIS — M6281 Muscle weakness (generalized): Secondary | ICD-10-CM | POA: Diagnosis not present

## 2024-01-24 DIAGNOSIS — Z9889 Other specified postprocedural states: Secondary | ICD-10-CM | POA: Diagnosis not present

## 2024-01-24 DIAGNOSIS — R262 Difficulty in walking, not elsewhere classified: Secondary | ICD-10-CM | POA: Diagnosis not present

## 2024-01-24 DIAGNOSIS — M25661 Stiffness of right knee, not elsewhere classified: Secondary | ICD-10-CM | POA: Diagnosis not present

## 2024-01-29 DIAGNOSIS — D649 Anemia, unspecified: Secondary | ICD-10-CM | POA: Diagnosis not present

## 2024-01-29 DIAGNOSIS — Q774 Achondroplasia: Secondary | ICD-10-CM | POA: Diagnosis not present

## 2024-01-29 DIAGNOSIS — M25651 Stiffness of right hip, not elsewhere classified: Secondary | ICD-10-CM | POA: Diagnosis not present

## 2024-01-29 DIAGNOSIS — M25661 Stiffness of right knee, not elsewhere classified: Secondary | ICD-10-CM | POA: Diagnosis not present

## 2024-01-29 DIAGNOSIS — Z9889 Other specified postprocedural states: Secondary | ICD-10-CM | POA: Diagnosis not present

## 2024-01-29 DIAGNOSIS — M6281 Muscle weakness (generalized): Secondary | ICD-10-CM | POA: Diagnosis not present

## 2024-01-29 DIAGNOSIS — R262 Difficulty in walking, not elsewhere classified: Secondary | ICD-10-CM | POA: Diagnosis not present

## 2024-01-29 DIAGNOSIS — Z4789 Encounter for other orthopedic aftercare: Secondary | ICD-10-CM | POA: Diagnosis not present

## 2024-01-30 DIAGNOSIS — Q774 Achondroplasia: Secondary | ICD-10-CM | POA: Diagnosis not present

## 2024-01-30 DIAGNOSIS — Z9889 Other specified postprocedural states: Secondary | ICD-10-CM | POA: Diagnosis not present

## 2024-01-31 DIAGNOSIS — Q774 Achondroplasia: Secondary | ICD-10-CM | POA: Diagnosis not present

## 2024-02-06 DIAGNOSIS — M25651 Stiffness of right hip, not elsewhere classified: Secondary | ICD-10-CM | POA: Diagnosis not present

## 2024-02-06 DIAGNOSIS — R262 Difficulty in walking, not elsewhere classified: Secondary | ICD-10-CM | POA: Diagnosis not present

## 2024-02-06 DIAGNOSIS — M6281 Muscle weakness (generalized): Secondary | ICD-10-CM | POA: Diagnosis not present

## 2024-02-06 DIAGNOSIS — M25661 Stiffness of right knee, not elsewhere classified: Secondary | ICD-10-CM | POA: Diagnosis not present

## 2024-02-06 DIAGNOSIS — Q774 Achondroplasia: Secondary | ICD-10-CM | POA: Diagnosis not present

## 2024-02-06 DIAGNOSIS — Z9889 Other specified postprocedural states: Secondary | ICD-10-CM | POA: Diagnosis not present

## 2024-02-06 DIAGNOSIS — M25662 Stiffness of left knee, not elsewhere classified: Secondary | ICD-10-CM | POA: Diagnosis not present

## 2024-02-09 DIAGNOSIS — R262 Difficulty in walking, not elsewhere classified: Secondary | ICD-10-CM | POA: Diagnosis not present

## 2024-02-09 DIAGNOSIS — M25662 Stiffness of left knee, not elsewhere classified: Secondary | ICD-10-CM | POA: Diagnosis not present

## 2024-02-09 DIAGNOSIS — Z9889 Other specified postprocedural states: Secondary | ICD-10-CM | POA: Diagnosis not present

## 2024-02-09 DIAGNOSIS — Q774 Achondroplasia: Secondary | ICD-10-CM | POA: Diagnosis not present

## 2024-02-09 DIAGNOSIS — M6281 Muscle weakness (generalized): Secondary | ICD-10-CM | POA: Diagnosis not present

## 2024-02-09 DIAGNOSIS — M25651 Stiffness of right hip, not elsewhere classified: Secondary | ICD-10-CM | POA: Diagnosis not present

## 2024-02-09 DIAGNOSIS — M25661 Stiffness of right knee, not elsewhere classified: Secondary | ICD-10-CM | POA: Diagnosis not present

## 2024-02-12 DIAGNOSIS — R262 Difficulty in walking, not elsewhere classified: Secondary | ICD-10-CM | POA: Diagnosis not present

## 2024-02-12 DIAGNOSIS — Z9889 Other specified postprocedural states: Secondary | ICD-10-CM | POA: Diagnosis not present

## 2024-02-12 DIAGNOSIS — M25651 Stiffness of right hip, not elsewhere classified: Secondary | ICD-10-CM | POA: Diagnosis not present

## 2024-02-12 DIAGNOSIS — M25661 Stiffness of right knee, not elsewhere classified: Secondary | ICD-10-CM | POA: Diagnosis not present

## 2024-02-12 DIAGNOSIS — Z4789 Encounter for other orthopedic aftercare: Secondary | ICD-10-CM | POA: Diagnosis not present

## 2024-02-12 DIAGNOSIS — Q774 Achondroplasia: Secondary | ICD-10-CM | POA: Diagnosis not present

## 2024-02-12 DIAGNOSIS — M6281 Muscle weakness (generalized): Secondary | ICD-10-CM | POA: Diagnosis not present

## 2024-02-12 DIAGNOSIS — M25662 Stiffness of left knee, not elsewhere classified: Secondary | ICD-10-CM | POA: Diagnosis not present

## 2024-02-14 DIAGNOSIS — M25661 Stiffness of right knee, not elsewhere classified: Secondary | ICD-10-CM | POA: Diagnosis not present

## 2024-02-14 DIAGNOSIS — R262 Difficulty in walking, not elsewhere classified: Secondary | ICD-10-CM | POA: Diagnosis not present

## 2024-02-14 DIAGNOSIS — Z9889 Other specified postprocedural states: Secondary | ICD-10-CM | POA: Diagnosis not present

## 2024-02-14 DIAGNOSIS — Q774 Achondroplasia: Secondary | ICD-10-CM | POA: Diagnosis not present

## 2024-02-14 DIAGNOSIS — M6281 Muscle weakness (generalized): Secondary | ICD-10-CM | POA: Diagnosis not present

## 2024-02-14 DIAGNOSIS — M25662 Stiffness of left knee, not elsewhere classified: Secondary | ICD-10-CM | POA: Diagnosis not present

## 2024-02-14 DIAGNOSIS — M25651 Stiffness of right hip, not elsewhere classified: Secondary | ICD-10-CM | POA: Diagnosis not present

## 2024-02-21 DIAGNOSIS — M25661 Stiffness of right knee, not elsewhere classified: Secondary | ICD-10-CM | POA: Diagnosis not present

## 2024-02-21 DIAGNOSIS — M25651 Stiffness of right hip, not elsewhere classified: Secondary | ICD-10-CM | POA: Diagnosis not present

## 2024-02-21 DIAGNOSIS — Q774 Achondroplasia: Secondary | ICD-10-CM | POA: Diagnosis not present

## 2024-02-21 DIAGNOSIS — R262 Difficulty in walking, not elsewhere classified: Secondary | ICD-10-CM | POA: Diagnosis not present

## 2024-02-21 DIAGNOSIS — M6281 Muscle weakness (generalized): Secondary | ICD-10-CM | POA: Diagnosis not present

## 2024-02-21 DIAGNOSIS — Z9889 Other specified postprocedural states: Secondary | ICD-10-CM | POA: Diagnosis not present

## 2024-02-21 DIAGNOSIS — M25662 Stiffness of left knee, not elsewhere classified: Secondary | ICD-10-CM | POA: Diagnosis not present

## 2024-02-23 DIAGNOSIS — M25651 Stiffness of right hip, not elsewhere classified: Secondary | ICD-10-CM | POA: Diagnosis not present

## 2024-02-23 DIAGNOSIS — Q774 Achondroplasia: Secondary | ICD-10-CM | POA: Diagnosis not present

## 2024-02-23 DIAGNOSIS — M25662 Stiffness of left knee, not elsewhere classified: Secondary | ICD-10-CM | POA: Diagnosis not present

## 2024-02-23 DIAGNOSIS — M25661 Stiffness of right knee, not elsewhere classified: Secondary | ICD-10-CM | POA: Diagnosis not present

## 2024-02-23 DIAGNOSIS — M6281 Muscle weakness (generalized): Secondary | ICD-10-CM | POA: Diagnosis not present

## 2024-02-23 DIAGNOSIS — R262 Difficulty in walking, not elsewhere classified: Secondary | ICD-10-CM | POA: Diagnosis not present

## 2024-02-23 DIAGNOSIS — Z9889 Other specified postprocedural states: Secondary | ICD-10-CM | POA: Diagnosis not present

## 2024-02-26 DIAGNOSIS — Z9889 Other specified postprocedural states: Secondary | ICD-10-CM | POA: Diagnosis not present

## 2024-02-26 DIAGNOSIS — M25651 Stiffness of right hip, not elsewhere classified: Secondary | ICD-10-CM | POA: Diagnosis not present

## 2024-02-26 DIAGNOSIS — M25662 Stiffness of left knee, not elsewhere classified: Secondary | ICD-10-CM | POA: Diagnosis not present

## 2024-02-26 DIAGNOSIS — R262 Difficulty in walking, not elsewhere classified: Secondary | ICD-10-CM | POA: Diagnosis not present

## 2024-02-26 DIAGNOSIS — Q774 Achondroplasia: Secondary | ICD-10-CM | POA: Diagnosis not present

## 2024-02-26 DIAGNOSIS — Z4789 Encounter for other orthopedic aftercare: Secondary | ICD-10-CM | POA: Diagnosis not present

## 2024-02-26 DIAGNOSIS — M6281 Muscle weakness (generalized): Secondary | ICD-10-CM | POA: Diagnosis not present

## 2024-02-26 DIAGNOSIS — M25661 Stiffness of right knee, not elsewhere classified: Secondary | ICD-10-CM | POA: Diagnosis not present

## 2024-02-28 DIAGNOSIS — R262 Difficulty in walking, not elsewhere classified: Secondary | ICD-10-CM | POA: Diagnosis not present

## 2024-02-28 DIAGNOSIS — M6281 Muscle weakness (generalized): Secondary | ICD-10-CM | POA: Diagnosis not present

## 2024-02-28 DIAGNOSIS — M25662 Stiffness of left knee, not elsewhere classified: Secondary | ICD-10-CM | POA: Diagnosis not present

## 2024-02-28 DIAGNOSIS — Q774 Achondroplasia: Secondary | ICD-10-CM | POA: Diagnosis not present

## 2024-02-28 DIAGNOSIS — M25661 Stiffness of right knee, not elsewhere classified: Secondary | ICD-10-CM | POA: Diagnosis not present

## 2024-02-28 DIAGNOSIS — M25651 Stiffness of right hip, not elsewhere classified: Secondary | ICD-10-CM | POA: Diagnosis not present

## 2024-02-28 DIAGNOSIS — Z9889 Other specified postprocedural states: Secondary | ICD-10-CM | POA: Diagnosis not present

## 2024-02-29 DIAGNOSIS — M79605 Pain in left leg: Secondary | ICD-10-CM | POA: Diagnosis not present

## 2024-03-01 DIAGNOSIS — Q774 Achondroplasia: Secondary | ICD-10-CM | POA: Diagnosis not present

## 2024-03-01 DIAGNOSIS — M25662 Stiffness of left knee, not elsewhere classified: Secondary | ICD-10-CM | POA: Diagnosis not present

## 2024-03-01 DIAGNOSIS — Z9889 Other specified postprocedural states: Secondary | ICD-10-CM | POA: Diagnosis not present

## 2024-03-01 DIAGNOSIS — M6281 Muscle weakness (generalized): Secondary | ICD-10-CM | POA: Diagnosis not present

## 2024-03-01 DIAGNOSIS — M25651 Stiffness of right hip, not elsewhere classified: Secondary | ICD-10-CM | POA: Diagnosis not present

## 2024-03-01 DIAGNOSIS — R262 Difficulty in walking, not elsewhere classified: Secondary | ICD-10-CM | POA: Diagnosis not present

## 2024-03-01 DIAGNOSIS — M25661 Stiffness of right knee, not elsewhere classified: Secondary | ICD-10-CM | POA: Diagnosis not present

## 2024-03-13 DIAGNOSIS — M25651 Stiffness of right hip, not elsewhere classified: Secondary | ICD-10-CM | POA: Diagnosis not present

## 2024-03-13 DIAGNOSIS — Q774 Achondroplasia: Secondary | ICD-10-CM | POA: Diagnosis not present

## 2024-03-13 DIAGNOSIS — M25662 Stiffness of left knee, not elsewhere classified: Secondary | ICD-10-CM | POA: Diagnosis not present

## 2024-03-13 DIAGNOSIS — R262 Difficulty in walking, not elsewhere classified: Secondary | ICD-10-CM | POA: Diagnosis not present

## 2024-03-13 DIAGNOSIS — M25661 Stiffness of right knee, not elsewhere classified: Secondary | ICD-10-CM | POA: Diagnosis not present

## 2024-03-13 DIAGNOSIS — Z9889 Other specified postprocedural states: Secondary | ICD-10-CM | POA: Diagnosis not present

## 2024-03-13 DIAGNOSIS — M6281 Muscle weakness (generalized): Secondary | ICD-10-CM | POA: Diagnosis not present

## 2024-03-15 DIAGNOSIS — M25651 Stiffness of right hip, not elsewhere classified: Secondary | ICD-10-CM | POA: Diagnosis not present

## 2024-03-15 DIAGNOSIS — M25662 Stiffness of left knee, not elsewhere classified: Secondary | ICD-10-CM | POA: Diagnosis not present

## 2024-03-15 DIAGNOSIS — R262 Difficulty in walking, not elsewhere classified: Secondary | ICD-10-CM | POA: Diagnosis not present

## 2024-03-15 DIAGNOSIS — M25661 Stiffness of right knee, not elsewhere classified: Secondary | ICD-10-CM | POA: Diagnosis not present

## 2024-03-15 DIAGNOSIS — Z9889 Other specified postprocedural states: Secondary | ICD-10-CM | POA: Diagnosis not present

## 2024-03-15 DIAGNOSIS — Q774 Achondroplasia: Secondary | ICD-10-CM | POA: Diagnosis not present

## 2024-03-15 DIAGNOSIS — Z4789 Encounter for other orthopedic aftercare: Secondary | ICD-10-CM | POA: Diagnosis not present

## 2024-03-15 DIAGNOSIS — M6281 Muscle weakness (generalized): Secondary | ICD-10-CM | POA: Diagnosis not present

## 2024-03-20 DIAGNOSIS — R262 Difficulty in walking, not elsewhere classified: Secondary | ICD-10-CM | POA: Diagnosis not present

## 2024-03-20 DIAGNOSIS — M25662 Stiffness of left knee, not elsewhere classified: Secondary | ICD-10-CM | POA: Diagnosis not present

## 2024-03-20 DIAGNOSIS — M6281 Muscle weakness (generalized): Secondary | ICD-10-CM | POA: Diagnosis not present

## 2024-03-20 DIAGNOSIS — Q774 Achondroplasia: Secondary | ICD-10-CM | POA: Diagnosis not present

## 2024-03-20 DIAGNOSIS — Z9889 Other specified postprocedural states: Secondary | ICD-10-CM | POA: Diagnosis not present

## 2024-03-20 DIAGNOSIS — M25661 Stiffness of right knee, not elsewhere classified: Secondary | ICD-10-CM | POA: Diagnosis not present

## 2024-03-20 DIAGNOSIS — M25651 Stiffness of right hip, not elsewhere classified: Secondary | ICD-10-CM | POA: Diagnosis not present

## 2024-03-22 DIAGNOSIS — R262 Difficulty in walking, not elsewhere classified: Secondary | ICD-10-CM | POA: Diagnosis not present

## 2024-03-22 DIAGNOSIS — Q774 Achondroplasia: Secondary | ICD-10-CM | POA: Diagnosis not present

## 2024-03-22 DIAGNOSIS — M25662 Stiffness of left knee, not elsewhere classified: Secondary | ICD-10-CM | POA: Diagnosis not present

## 2024-03-22 DIAGNOSIS — M25651 Stiffness of right hip, not elsewhere classified: Secondary | ICD-10-CM | POA: Diagnosis not present

## 2024-03-22 DIAGNOSIS — M6281 Muscle weakness (generalized): Secondary | ICD-10-CM | POA: Diagnosis not present

## 2024-03-22 DIAGNOSIS — M25661 Stiffness of right knee, not elsewhere classified: Secondary | ICD-10-CM | POA: Diagnosis not present

## 2024-03-22 DIAGNOSIS — Z9889 Other specified postprocedural states: Secondary | ICD-10-CM | POA: Diagnosis not present

## 2024-04-09 ENCOUNTER — Other Ambulatory Visit: Payer: Self-pay

## 2024-04-09 ENCOUNTER — Ambulatory Visit

## 2024-04-09 DIAGNOSIS — R252 Cramp and spasm: Secondary | ICD-10-CM | POA: Diagnosis present

## 2024-04-09 DIAGNOSIS — M25562 Pain in left knee: Secondary | ICD-10-CM | POA: Insufficient documentation

## 2024-04-09 DIAGNOSIS — M25561 Pain in right knee: Secondary | ICD-10-CM | POA: Insufficient documentation

## 2024-04-09 DIAGNOSIS — M25572 Pain in left ankle and joints of left foot: Secondary | ICD-10-CM | POA: Diagnosis present

## 2024-04-09 DIAGNOSIS — M6281 Muscle weakness (generalized): Secondary | ICD-10-CM | POA: Diagnosis present

## 2024-04-09 DIAGNOSIS — M25571 Pain in right ankle and joints of right foot: Secondary | ICD-10-CM | POA: Diagnosis present

## 2024-04-09 DIAGNOSIS — R262 Difficulty in walking, not elsewhere classified: Secondary | ICD-10-CM | POA: Diagnosis present

## 2024-04-09 DIAGNOSIS — R293 Abnormal posture: Secondary | ICD-10-CM | POA: Insufficient documentation

## 2024-04-09 NOTE — Therapy (Unsigned)
 " OUTPATIENT PHYSICAL THERAPY LOWER EXTREMITY EVALUATION   Patient Name: Andrea Harrison MRN: [id number] DOB:1996/01/28, 28 y.o., female Today's Date: 04/09/2024  END OF SESSION:  PT End of Session - 04/09/24 1544     Visit Number 1    Number of Visits 15    Date for Recertification  06/04/24    Authorization Type BCBS    Authorization Time Period 30 per condition? has used 15 to date    Authorization - Visit Number 1    Authorization - Number of Visits 15    Progress Note Due on Visit 10    PT Start Time 1538    PT Stop Time 1620    PT Time Calculation (min) 42 min          History reviewed. No pertinent past medical history. 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  First surgery was on 01/16/24 for right LE and second surgery was on left LE on 10/21.  She is allowed approx 75% weight bearing at this time.  She is able to transfer independently.  And use her walker for short distances.  She is accompanied by her mother today.  She arrives in wheelchair.  She denies any significant pain, rating this at 1/2 out of 10.  She still has several weeks of lengthening schedule and will not likely be weight bearing  until this schedule is complete and bone growth is completed.  However, MD would like for patient to participate in PT to avoid contractures and prepare for gait training.    PERTINENT HISTORY: Achondroplasia PAIN:  Are you having pain? Yes: NPRS scale: .5/10 Pain location: both legs Pain description: mild ache Aggravating factors: weight bearing Relieving factors: rest  PRECAUTIONS: None  RED FLAGS: None   WEIGHT BEARING RESTRICTIONS: Yes 50 lbs (about 75% wb)  FALLS:  Has patient fallen in last 6 months? No  LIVING ENVIRONMENT: Lives with: lives with their family Lives in: House/apartment  OCCUPATION: na  PLOF: Independent, Independent with basic ADLs, Independent with household mobility without device, Independent with community mobility without device, Independent with homemaking with ambulation, Independent with gait, and Independent with transfers  PATIENT GOALS: to resume walking  NEXT MD VISIT: every 2 weeks for xrays  OBJECTIVE:  Note: Objective measures were completed at Evaluation unless otherwise noted.  DIAGNOSTIC FINDINGS: none found for LE's  PATIENT SURVEYS:  LEFS  Extreme difficulty/unable (0), Quite a bit of difficulty (1), Moderate difficulty (2), Little difficulty (3), No difficulty (4) Survey date:  04/09/24  Score total:  37/80     COGNITION: Overall cognitive status: Within functional limits for tasks assessed     SENSATION: WFL  POSTURE: achondroplasia  PALPATION:  na  LOWER EXTREMITY ROM:  Active ROM Right eval Left eval  Hip flexion    Hip extension    Hip abduction    Hip adduction    Hip internal rotation    Hip external rotation    Knee flexion    Knee extension  -4  Ankle dorsiflexion 4 10  Ankle plantarflexion  excess  Ankle inversion    Ankle eversion 2    (Blank rows = not tested)  LOWER EXTREMITY MMT:  MMT Right eval Left eval  Hip flexion    Hip extension    Hip abduction    Hip adduction    Hip  internal rotation    Hip external rotation    Knee flexion    Knee extension    Ankle dorsiflexion    Ankle plantarflexion    Ankle inversion    Ankle eversion     (Blank rows = not tested)  LOWER EXTREMITY SPECIAL TESTS:  na  FUNCTIONAL TESTS:  5 times sit to stand: will complete when patient is full WB Timed up and go (TUG): will complete when patient is full WB  TRANSFERS: Independent with all transfers using rolling walker.  May need assist with showering for safety.    GAIT: Distance walked: 5 feet Assistive device utilized: Walker - 2 wheeled Level of assistance: Complete Independence and SBA Comments: hop to gait                                                                                                                                TREATMENT DATE: 04/09/24 Initial eval completed and initiated HEP    PATIENT EDUCATION:  Education details: Initiated HEP Person educated: Patient and Parent Education method: Programmer, Multimedia, Facilities Manager, Verbal cues, and Handouts Education comprehension: verbalized understanding, returned demonstration, and verbal cues required  HOME EXERCISE PROGRAM: Access Code: [email or web address] URL: [email or web address] Date: 04/09/2024 Prepared by: Delon Haddock  Exercises - Seated Ankle Alphabet  - 1 x daily - 7 x weekly - 1 sets - 1 reps - a-z hold - Standing Bilateral Gastroc Stretch with Step  - 1 x daily - 7 x weekly - 1 sets - 10 reps - 10 sec hold - Seated Ankle Inversion Eversion AROM  - 1 x daily - 7 x weekly - 1 sets - 10 reps - 5 sec each side hold - Long Sitting Calf Stretch with Strap  - 1 x daily - 7 x weekly - 1 sets - 10 reps - 10 sec hold  ASSESSMENT:  CLINICAL IMPRESSION: Patient is a 28 y.o. female who was seen today for physical therapy evaluation and treatment for post bilateral osteotomy.  She presents with decreased ankle ROM, weakness bilateral LE's and overall limited function due to wb restrictions.   She would benefit from skilled PT to avoid contractures and muscle atrophy due to wb restrictions for a prolonged period of time.     OBJECTIVE IMPAIRMENTS: Abnormal  gait, decreased balance, decreased mobility, difficulty walking, decreased ROM, decreased strength, increased fascial restrictions, increased muscle spasms, impaired flexibility, postural dysfunction, and pain.   ACTIVITY LIMITATIONS: carrying, lifting, bending, standing, squatting, stairs, transfers, bed mobility, bathing, toileting, dressing, and hygiene/grooming  PARTICIPATION LIMITATIONS: meal prep, cleaning, laundry, driving, shopping, and community activity  PERSONAL FACTORS: Past/current experiences and 1 comorbidity: achondroplasia are also affecting patient's functional outcome.   REHAB POTENTIAL: Good  CLINICAL DECISION MAKING: Stable/uncomplicated  EVALUATION COMPLEXITY: Low   GOALS: Goals reviewed with patient? Yes  SHORT TERM GOALS: Target date: 05/21/2024  Pain report to be no greater than 4/10  Baseline: Goal status: INITIAL  2.  Patient will be independent with initial HEP  Baseline:  Goal status: INITIAL   LONG TERM GOALS: Target date: 07/02/2024  Patient to report pain no greater than 2/10  Baseline:  Goal status: INITIAL  2.  Patient to be independent with advanced HEP  Baseline:  Goal status: INITIAL  3.  Increased left ankle ROM to [facility] Baseline:  Goal status: INITIAL  4.  Increase left knee extension to 0 degrees Baseline:  Goal status: INITIAL  5.  Patient to be able to stand or walk for at least 15 min without leg pain  Baseline:  Goal status: INITIAL  6.  Patient to be able to ambulate 300 feet without assistive device with normal heel to toe progresseion Baseline:  Goal status: INITIAL   PLAN:  PT FREQUENCY: 1-2x/week  PT DURATION: 12 weeks  PLANNED INTERVENTIONS: 97110-Therapeutic exercises, 97530- Therapeutic activity, 97112- Neuromuscular re-education, 97535- Self  Care, 02859- Manual therapy, [phone]- Gait training, [phone]- Aquatic Therapy, [phone]- Electrical stimulation (unattended), [phone]- Electrical stimulation (manual), [id number]- Vasopneumatic device, [id number]- Ionotophoresis 4mg /ml Dexamethasone , 79439 (1-2 muscles), 20561 (3+ muscles)- Dry Needling, Patient/Family education, Balance training, Stair training, Taping, Joint mobilization, Scar mobilization, DME instructions, Wheelchair mobility training, Cryotherapy, and Moist heat  PLAN FOR NEXT SESSION: focus on ankle ROM, osteotomy protocol, patient is allowed 50 lbs of weight bearing, quad rehab.     Delon B. Ulysess Witz, PT 04/09/2024 9:40 PM [facility] [address] [place], [place] [address] Phone # [phone] Fax [phone]  "

## 2024-04-16 ENCOUNTER — Ambulatory Visit: Attending: Orthopaedic Surgery

## 2024-04-16 DIAGNOSIS — M25572 Pain in left ankle and joints of left foot: Secondary | ICD-10-CM | POA: Diagnosis present

## 2024-04-16 DIAGNOSIS — M25561 Pain in right knee: Secondary | ICD-10-CM | POA: Diagnosis present

## 2024-04-16 DIAGNOSIS — R252 Cramp and spasm: Secondary | ICD-10-CM | POA: Diagnosis present

## 2024-04-16 DIAGNOSIS — R262 Difficulty in walking, not elsewhere classified: Secondary | ICD-10-CM | POA: Insufficient documentation

## 2024-04-16 DIAGNOSIS — R293 Abnormal posture: Secondary | ICD-10-CM | POA: Insufficient documentation

## 2024-04-16 DIAGNOSIS — M6281 Muscle weakness (generalized): Secondary | ICD-10-CM | POA: Diagnosis present

## 2024-04-16 DIAGNOSIS — M25571 Pain in right ankle and joints of right foot: Secondary | ICD-10-CM | POA: Diagnosis present

## 2024-04-16 DIAGNOSIS — M25562 Pain in left knee: Secondary | ICD-10-CM | POA: Insufficient documentation

## 2024-04-16 NOTE — Therapy (Signed)
 " OUTPATIENT PHYSICAL THERAPY TREATMENT   Patient Name: Andrea Harrison MRN: [id number] DOB:1995-07-24, 29 y.o., female Today's Date: 04/16/2024  END OF SESSION:  PT End of Session - 04/16/24 0942     Visit Number 2    Date for Recertification  06/04/24    Authorization Type BCBS    Authorization Time Period 30 per condition? has used 15 to date    Authorization - Visit Number 1    Authorization - Number of Visits 30    PT Start Time 0857   late   PT Stop Time 0933    PT Time Calculation (min) 36 min    Activity Tolerance Patient tolerated treatment well    Behavior During Therapy [facility] for tasks assessed/performed           History reviewed. No pertinent past medical history. 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  First surgery was on 01/16/24 for right LE and second surgery was on left LE on 10/21.  She is allowed approx 75% weight bearing at this time.  She is able to transfer independently.  And use her walker for short distances.  She is accompanied by her mother today.  She arrives in wheelchair.  She denies any significant pain, rating this at 1/2 out of 10.  She still has several weeks of lengthening schedule and  will not likely be weight bearing until this schedule is complete and bone growth is completed.  However, MD would like for patient to participate in PT to avoid contractures and prepare for gait training.    PERTINENT HISTORY: Achondroplasia PAIN:  Are you having pain? Yes: NPRS scale: .5/10 Pain location: both legs Pain description: mild ache Aggravating factors: weight bearing Relieving factors: rest  PRECAUTIONS: None  RED FLAGS: None   WEIGHT BEARING RESTRICTIONS: Yes 50 lbs (about 75% wb)  FALLS:  Has patient fallen in last 6 months? No  LIVING ENVIRONMENT: Lives with: lives with their family Lives in: House/apartment  OCCUPATION: na  PLOF: Independent, Independent with basic ADLs, Independent with household mobility without device, Independent with community mobility without device, Independent with homemaking with ambulation, Independent with gait, and Independent with transfers  PATIENT GOALS: to resume walking  NEXT MD VISIT: every 2 weeks for xrays  OBJECTIVE:  Note: Objective measures were completed at Evaluation unless otherwise noted.  DIAGNOSTIC FINDINGS: none found for LE's  PATIENT SURVEYS:  LEFS  Extreme difficulty/unable (0), Quite a bit of difficulty (1), Moderate difficulty (2), Little difficulty (3), No difficulty (4) Survey date:  04/09/24  Score total:  37/80     COGNITION: Overall cognitive status: Within functional limits for tasks assessed     SENSATION: WFL  POSTURE: achondroplasia  PALPATION: na  LOWER EXTREMITY ROM:  Active ROM Right eval Left eval  Hip flexion    Hip extension    Hip abduction    Hip adduction    Hip internal rotation    Hip external rotation    Knee flexion    Knee extension  -4  Ankle dorsiflexion 4 10  Ankle plantarflexion  excess  Ankle inversion    Ankle eversion 2    (Blank rows = not tested)  LOWER EXTREMITY MMT:  MMT Right eval Left eval  Hip flexion    Hip extension    Hip  abduction    Hip adduction    Hip internal rotation    Hip external rotation    Knee flexion    Knee extension    Ankle dorsiflexion    Ankle plantarflexion    Ankle inversion    Ankle eversion     (Blank rows = not tested)  LOWER EXTREMITY SPECIAL TESTS:  na  FUNCTIONAL TESTS:  5 times sit to stand: will complete when patient is full WB Timed up and go (TUG): will complete when patient is full WB  TRANSFERS: Independent with all transfers using rolling walker.  May need assist with showering for safety.    GAIT: Distance walked: 5 feet Assistive device utilized: Environmental Consultant - 2 wheeled Level of assistance: Complete Independence and SBA Comments: hop to gait                                                                                                                                TREATMENT DATE:  04/16/24 Reclined on wedge:  Ankle pumps x 20 bil Ankle DF stretch with strap: 2x20 seconds bil  Supine hamstring stretch: 3x15 seconds  Short arc quad with green pool noodle under knees 5 hold x10  Straight leg raise 2x5 each with quad set Seated 1/2 foam roll: DF/PF and Inversion/eversion - showed pt how to use opposite limb for overpressure on roll Manual: passive stretch into DF bil and hamstring stretch to pt tolerance   04/09/24 Initial eval completed and initiated HEP    PATIENT EDUCATION:  Education details: Initiated HEP Person educated: Patient and Parent Education method: Programmer, Multimedia, Facilities Manager, Verbal cues, and Handouts Education comprehension: verbalized understanding, returned demonstration, and verbal cues required  HOME EXERCISE PROGRAM: Access Code: [email or web address] URL: [email or web address] Date: 04/16/2024 Prepared by: Burnard  Exercises - Seated Ankle Alphabet  - 1 x daily - 7 x weekly - 1 sets - 1 reps - a-z hold - Standing Bilateral Gastroc Stretch with Step  - 1 x daily - 7 x weekly - 1 sets - 10 reps - 10 sec hold - Seated Ankle  Inversion Eversion AROM  - 1 x daily - 7 x weekly - 1 sets - 10 reps - 5 sec each side hold - Long Sitting Calf Stretch with Strap  - 1 x daily - 7 x weekly - 1 sets -  10 reps - 10 sec hold - Supine Hamstring Stretch with Strap  - 2 x daily - 7 x weekly - 1 sets - 5 reps - 15 hold - Supine Short Arc Quad  - 2 x daily - 7 x weekly - 2 sets - 10 reps - 5 hold - Supine Active Straight Leg Raise  - 2 x daily - 7 x weekly - 1-2 sets - 5 reps  ASSESSMENT:  CLINICAL IMPRESSION: First time follow-up after evaluation.  Review of HEP and added to HEP for LE strength and flexibility.  Pt was challenged with SLR and PT provided verbal cues for ankle DF and quad set. Pt reported minimal compliance with HEP and PT emphasized the importance of compliance for flexibility and strength gains to prevent contracture.  She would benefit from skilled PT to avoid contractures and muscle atrophy due to wb restrictions for a prolonged period of time.     OBJECTIVE IMPAIRMENTS: Abnormal gait, decreased balance, decreased mobility, difficulty walking, decreased ROM, decreased strength, increased fascial restrictions, increased muscle spasms, impaired flexibility, postural dysfunction, and pain.   ACTIVITY LIMITATIONS: carrying, lifting, bending, standing, squatting, stairs, transfers, bed mobility, bathing, toileting, dressing, and hygiene/grooming  PARTICIPATION LIMITATIONS: meal prep, cleaning, laundry, driving, shopping, and community activity  PERSONAL FACTORS: Past/current experiences and 1 comorbidity: achondroplasia are also affecting patient's functional outcome.   REHAB POTENTIAL: Good  CLINICAL DECISION MAKING: Stable/uncomplicated  EVALUATION COMPLEXITY: Low   GOALS: Goals reviewed with patient? Yes  SHORT TERM GOALS: Target date: 05/21/2024  Pain report to be no greater than 4/10  Baseline: Goal status: INITIAL  2.  Patient will be independent with initial HEP  Baseline:  Goal status: In  progress (04/16/24)   LONG TERM GOALS: Target date: 07/02/2024  Patient to report pain no greater than 2/10  Baseline:  Goal status: INITIAL  2.  Patient to be independent with advanced HEP  Baseline:  Goal status: INITIAL  3.  Increased left ankle ROM to [facility] Baseline:  Goal status: INITIAL  4.  Increase left knee extension to 0 degrees Baseline:  Goal status: INITIAL  5.  Patient to be able to stand or walk for at least 15 min without leg pain  Baseline:  Goal status: INITIAL  6.  Patient to be able to ambulate 300 feet without assistive device with normal heel to toe progresseion Baseline:  Goal status: INITIAL   PLAN:  PT FREQUENCY: 1-2x/week  PT DURATION: 12 weeks  PLANNED INTERVENTIONS: 97110-Therapeutic exercises, 97530- Therapeutic activity, 97112- Neuromuscular re-education, 97535- Self Care, 02859- Manual therapy, [phone]- Gait training, [phone]- Aquatic Therapy, [phone]- Electrical stimulation (unattended), [phone]- Electrical stimulation (manual), [id number]- Vasopneumatic device, [id number]- Ionotophoresis 4mg /ml Dexamethasone , 79439 (1-2 muscles), 20561 (3+ muscles)- Dry Needling, Patient/Family education, Balance training, Stair training, Taping, Joint mobilization, Scar mobilization, DME instructions, Wheelchair mobility training, Cryotherapy, and Moist heat  PLAN FOR NEXT SESSION: focus on ankle ROM, osteotomy protocol, patient is allowed 50 lbs of weight bearing, quad rehab.     Burnard Joy, PT 04/16/2024 9:54 AM  [facility] [address], [address] [place], [place] [address] Phone # [phone] Fax [phone]  "

## 2024-04-18 ENCOUNTER — Other Ambulatory Visit: Payer: Self-pay | Admitting: Orthopedic Surgery

## 2024-04-18 ENCOUNTER — Ambulatory Visit
Admission: RE | Admit: 2024-04-18 | Discharge: 2024-04-18 | Disposition: A | Discharge status: Home / Self Care | Source: Ambulatory Visit | Attending: Orthopedic Surgery | Admitting: Orthopedic Surgery

## 2024-04-18 ENCOUNTER — Ambulatory Visit

## 2024-04-18 DIAGNOSIS — M25572 Pain in left ankle and joints of left foot: Secondary | ICD-10-CM

## 2024-04-18 DIAGNOSIS — R262 Difficulty in walking, not elsewhere classified: Secondary | ICD-10-CM

## 2024-04-18 DIAGNOSIS — R252 Cramp and spasm: Secondary | ICD-10-CM

## 2024-04-18 DIAGNOSIS — M6281 Muscle weakness (generalized): Secondary | ICD-10-CM

## 2024-04-18 DIAGNOSIS — Z9889 Other specified postprocedural states: Secondary | ICD-10-CM

## 2024-04-18 DIAGNOSIS — M25561 Pain in right knee: Secondary | ICD-10-CM | POA: Diagnosis not present

## 2024-04-18 DIAGNOSIS — M25571 Pain in right ankle and joints of right foot: Secondary | ICD-10-CM

## 2024-04-18 DIAGNOSIS — M25562 Pain in left knee: Secondary | ICD-10-CM

## 2024-04-18 NOTE — Therapy (Signed)
 " OUTPATIENT PHYSICAL THERAPY TREATMENT   Patient Name: Andrea Harrison MRN: [id number] DOB:1995-04-23, 29 y.o., female Today's Date: 04/18/2024  END OF SESSION:  PT End of Session - 04/18/24 0933     Visit Number 3    Date for Recertification  06/04/24    Authorization Type BCBS    Authorization Time Period 30 per condition? has used 15 to date    Authorization - Visit Number 2    Authorization - Number of Visits 30    PT Start Time [id number]    PT Stop Time 0930    PT Time Calculation (min) 41 min    Activity Tolerance Patient tolerated treatment well    Behavior During Therapy [facility] for tasks assessed/performed            History reviewed. No pertinent past medical history. 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  First surgery was on 01/16/24 for right LE and second surgery was on left LE on 10/21.  She is allowed approx 75% weight bearing at this time.  She is able to transfer independently.  And use her walker for short distances.  She is accompanied by her mother today.  She arrives in wheelchair.  She denies any significant pain, rating this at 1/2 out of 10.  She still has several weeks of lengthening  schedule and will not likely be weight bearing until this schedule is complete and bone growth is completed.  However, MD would like for patient to participate in PT to avoid contractures and prepare for gait training.    PERTINENT HISTORY: Achondroplasia PAIN:  Are you having pain? Yes: NPRS scale: .5/10 Pain location: both legs Pain description: mild ache Aggravating factors: weight bearing Relieving factors: rest  PRECAUTIONS: None  RED FLAGS: None   WEIGHT BEARING RESTRICTIONS: Yes 50 lbs (about 75% wb)  FALLS:  Has patient fallen in last 6 months? No  LIVING ENVIRONMENT: Lives with: lives with their family Lives in: House/apartment  OCCUPATION: na  PLOF: Independent, Independent with basic ADLs, Independent with household mobility without device, Independent with community mobility without device, Independent with homemaking with ambulation, Independent with gait, and Independent with transfers  PATIENT GOALS: to resume walking  NEXT MD VISIT: every 2 weeks for xrays  OBJECTIVE:  Note: Objective measures were completed at Evaluation unless otherwise noted.  DIAGNOSTIC FINDINGS: none found for LE's  PATIENT SURVEYS:  LEFS  Extreme difficulty/unable (0), Quite a bit of difficulty (1), Moderate difficulty (2), Little difficulty (3), No difficulty (4) Survey date:  04/09/24  Score total:  37/80     COGNITION: Overall cognitive status: Within functional limits for tasks assessed     SENSATION: [facility]  POSTURE: achondroplasia  PALPATION: na  LOWER EXTREMITY ROM:  Active ROM Right eval Left eval  Hip flexion    Hip extension    Hip abduction    Hip adduction    Hip internal rotation    Hip external rotation    Knee flexion    Knee extension  -4  Ankle dorsiflexion 4 10  Ankle plantarflexion  excess  Ankle inversion    Ankle eversion 2    (Blank rows = not tested)  LOWER EXTREMITY MMT:  MMT Right eval Left eval  Hip flexion    Hip extension     Hip abduction    Hip adduction    Hip internal rotation    Hip external rotation    Knee flexion    Knee extension    Ankle dorsiflexion    Ankle plantarflexion    Ankle inversion    Ankle eversion     (Blank rows = not tested)  LOWER EXTREMITY SPECIAL TESTS:  na  FUNCTIONAL TESTS:  5 times sit to stand: will complete when patient is full WB Timed up and go (TUG): will complete when patient is full WB  TRANSFERS: Independent with all transfers using rolling walker.  May need assist with showering for safety.    GAIT: Distance walked: 5 feet Assistive device utilized: Environmental Consultant - 2 wheeled Level of assistance: Complete Independence and SBA Comments: hop to gait                                                                                                                                TREATMENT DATE:   04/18/24 Reclined on wedge:  Quad set: with towel roll under knee- emphasis on DF   Supine hamstring stretch: 3x15 seconds  Short arc quad with foam roll under knees 5 hold x10  Straight leg raise 2x5 each with quad set Seated 1/2 foam roll: DF/PF and Inversion/eversion - showed pt how to use opposite limb for overpressure on roll Manual: passive stretch into DF bil and hamstring stretch to pt tolerance  Seated: long arc quad x10 each  Sidelying:  Clam with TA activation 2x10 Standing:  Gastroc stretch using 1/2 foam roll against wall and UE support on walker to maintain 50# weightbearing Weight shift to encourage heel to ground for stretch (50#) and UE support    04/16/24 Reclined on wedge:  Ankle pumps x 20 bil Ankle DF stretch with strap: 2x20 seconds bil  Supine hamstring stretch: 3x15 seconds  Short arc quad with green pool noodle under knees 5 hold x10  Straight leg raise 2x5 each with quad set Seated 1/2 foam roll: DF/PF and Inversion/eversion - showed pt how to use opposite limb for overpressure on roll Manual: passive stretch into DF bil and hamstring  stretch to pt tolerance   04/09/24 Initial eval completed and initiated HEP    PATIENT EDUCATION:  Education details: Initiated HEP Person educated: Patient and Parent  Education method: Explanation, Demonstration, Verbal cues, and Handouts Education comprehension: verbalized understanding, returned demonstration, and verbal cues required  HOME EXERCISE PROGRAM: Access Code: [email or web address] URL: [email or web address] Date: 04/16/2024 Prepared by: Burnard  Exercises - Seated Ankle Alphabet  - 1 x daily - 7 x weekly - 1 sets - 1 reps - a-z hold - Standing Bilateral Gastroc Stretch with Step  - 1 x daily - 7 x weekly - 1 sets - 10 reps - 10 sec hold - Seated Ankle Inversion Eversion AROM  - 1 x daily - 7 x weekly - 1 sets - 10 reps - 5 sec each side hold - Long Sitting Calf Stretch with Strap  - 1 x daily - 7 x weekly - 1 sets - 10 reps - 10 sec hold - Supine Hamstring Stretch with Strap  - 2 x daily - 7 x weekly - 1 sets - 5 reps - 15 hold - Supine Short Arc Quad  - 2 x daily - 7 x weekly - 2 sets - 10 reps - 5 hold - Supine Active Straight Leg Raise  - 2 x daily - 7 x weekly - 1-2 sets - 5 reps  ASSESSMENT:  CLINICAL IMPRESSION: Pt has been more consistent with HEP and reports some muscle soreness in the gastroc and quads today. PT introduced TA activation and glute strength today to assist with transition when she is able to weightbear fully.  Pt with Rt foot inversion and she will use strap to evert foot with stretch at home.  She would benefit from skilled PT to avoid contractures and muscle atrophy due to wb restrictions for a prolonged period of time.     OBJECTIVE IMPAIRMENTS: Abnormal gait, decreased balance, decreased mobility, difficulty walking, decreased ROM, decreased strength, increased fascial restrictions, increased muscle spasms, impaired flexibility, postural dysfunction, and pain.   ACTIVITY LIMITATIONS: carrying, lifting, bending, standing, squatting, stairs,  transfers, bed mobility, bathing, toileting, dressing, and hygiene/grooming  PARTICIPATION LIMITATIONS: meal prep, cleaning, laundry, driving, shopping, and community activity  PERSONAL FACTORS: Past/current experiences and 1 comorbidity: achondroplasia are also affecting patient's functional outcome.   REHAB POTENTIAL: Good  CLINICAL DECISION MAKING: Stable/uncomplicated  EVALUATION COMPLEXITY: Low   GOALS: Goals reviewed with patient? Yes  SHORT TERM GOALS: Target date: 05/21/2024  Pain report to be no greater than 4/10  Baseline: Goal status: INITIAL  2.  Patient will be independent with initial HEP  Baseline:  Goal status: In progress (04/16/24)   LONG TERM GOALS: Target date: 07/02/2024  Patient to report pain no greater than 2/10  Baseline:  Goal status: INITIAL  2.  Patient to be independent with advanced HEP  Baseline:  Goal status: INITIAL  3.  Increased left ankle ROM to [facility] Baseline:  Goal status: INITIAL  4.  Increase left knee extension to 0 degrees Baseline:  Goal status: INITIAL  5.  Patient to be able to stand or walk for at least 15 min without leg pain  Baseline:  Goal status: INITIAL  6.  Patient to be able to ambulate 300 feet without assistive device with normal heel to toe progresseion Baseline:  Goal status: INITIAL   PLAN:  PT FREQUENCY: 1-2x/week  PT DURATION: 12 weeks  PLANNED INTERVENTIONS: 97110-Therapeutic exercises, 97530- Therapeutic activity, [id number]- Neuromuscular re-education, 97535- Self Care, 02859- Manual therapy, [id number]- Gait training, [phone]- Aquatic Therapy, [phone]- Electrical stimulation (unattended), [id number]- Electrical stimulation (manual), [id number]- Vasopneumatic device, [id number]- Ionotophoresis 4mg /ml Dexamethasone , 79439 (1-2 muscles), 20561 (3+ muscles)- Dry Needling, Patient/Family  education, Balance training, Stair training, Taping, Joint mobilization, Scar mobilization, DME instructions, Wheelchair mobility training,  Cryotherapy, and Moist heat  PLAN FOR NEXT SESSION: focus on ankle ROM, osteotomy protocol, patient is allowed 50 lbs of weight bearing, quad rehab.     Burnard Joy, PT 04/18/2024 9:34 AM  [facility] [address], [address] [place], [place] [address] Phone # [phone] Fax [phone]  "

## 2024-04-23 ENCOUNTER — Ambulatory Visit

## 2024-04-23 DIAGNOSIS — R262 Difficulty in walking, not elsewhere classified: Secondary | ICD-10-CM

## 2024-04-23 DIAGNOSIS — M6281 Muscle weakness (generalized): Secondary | ICD-10-CM

## 2024-04-23 DIAGNOSIS — M25561 Pain in right knee: Secondary | ICD-10-CM | POA: Diagnosis not present

## 2024-04-23 DIAGNOSIS — M25562 Pain in left knee: Secondary | ICD-10-CM

## 2024-04-23 DIAGNOSIS — M25571 Pain in right ankle and joints of right foot: Secondary | ICD-10-CM

## 2024-04-23 DIAGNOSIS — M25572 Pain in left ankle and joints of left foot: Secondary | ICD-10-CM

## 2024-04-23 NOTE — Therapy (Signed)
 " OUTPATIENT PHYSICAL THERAPY TREATMENT   Patient Name: Andrea Harrison MRN: [id number] DOB:[birth year], 29 y.o., female Today's Date: 04/23/2024  END OF SESSION:  PT End of Session - 04/23/24 1206     Visit Number 4    Date for Recertification  06/04/24    Authorization Type BCBS    Authorization Time Period 30 per condition? has used 15 to date    Authorization - Visit Number 3    Authorization - Number of Visits 30    PT Start Time 1104    PT Stop Time 1145    PT Time Calculation (min) 41 min    Activity Tolerance Patient tolerated treatment well    Behavior During Therapy WFL for tasks assessed/performed             History reviewed. No pertinent past medical history. 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  I have a brace that I am supposed to wear and I don't wear it much.    From evaluation: Patient underwent tibal and femoral osteotomy during two separate surgeries.  First surgery was on 01/16/24 for right LE and second surgery was on left LE on 10/21.  She is allowed approx 75% weight bearing at this time.  She is able to transfer independently.  And use her walker for short distances.  She is accompanied by her mother today.  She arrives in  wheelchair.  She denies any significant pain, rating this at 1/2 out of 10.  She still has several weeks of lengthening schedule and will not likely be weight bearing until this schedule is complete and bone growth is completed.  However, MD would like for patient to participate in PT to avoid contractures and prepare for gait training.    PERTINENT HISTORY: Achondroplasia PAIN:  Are you having pain? Yes: NPRS scale: 0/10 Pain location: both legs Pain description: mild ache Aggravating factors: weight bearing Relieving factors: rest  PRECAUTIONS: None  RED FLAGS: None   WEIGHT BEARING RESTRICTIONS: Yes 50 lbs (about 75% wb)  FALLS:  Has patient fallen in last 6 months? No  LIVING ENVIRONMENT: Lives with: lives with their family Lives in: House/apartment  OCCUPATION: na  PLOF: Independent, Independent with basic ADLs, Independent with household mobility without device, Independent with community mobility without device, Independent with homemaking with ambulation, Independent with gait, and Independent with transfers  PATIENT GOALS: to resume walking  NEXT MD VISIT: every 2 weeks for xrays  OBJECTIVE:  Note: Objective measures were completed at Evaluation unless otherwise noted.  DIAGNOSTIC FINDINGS: none found for LE's  PATIENT SURVEYS:  LEFS  Extreme difficulty/unable (0), Quite a bit of difficulty (1), Moderate difficulty (2), Little difficulty (3), No difficulty (4) Survey date:  04/09/24  Score total:  37/80     COGNITION: Overall cognitive status: Within functional limits for tasks assessed     SENSATION: WFL  POSTURE: achondroplasia  PALPATION: na  LOWER EXTREMITY ROM:  Active ROM Right eval Left eval  Hip flexion    Hip extension    Hip abduction    Hip adduction    Hip internal rotation    Hip external rotation    Knee flexion    Knee extension  -4  Ankle dorsiflexion 4 10  Ankle plantarflexion  excess  Ankle inversion    Ankle eversion  2    (Blank rows = not tested)  LOWER EXTREMITY MMT:  MMT Right eval Left eval  Hip flexion    Hip extension    Hip abduction    Hip adduction    Hip internal rotation    Hip external rotation    Knee flexion    Knee extension    Ankle dorsiflexion    Ankle plantarflexion    Ankle inversion    Ankle eversion     (Blank rows = not tested)  LOWER EXTREMITY SPECIAL TESTS:  na  FUNCTIONAL TESTS:  5 times sit to stand: will complete when patient is full WB Timed up and go (TUG): will complete when patient is full WB  TRANSFERS: Independent with all transfers using rolling walker.  May need assist with showering for safety.    GAIT: Distance walked: 5 feet Assistive device utilized: Walker - 2 wheeled Level of assistance: Complete Independence and SBA Comments: hop to gait                                                                                                                                TREATMENT DATE:    04/23/24 Reclined on wedge:  Quad set: with towel roll under knee- emphasis on DF   Supine hamstring stretch: 5x15 seconds-passive by PT Bridge: 5 hold x10 with TA activation  Straight leg raise 2x5 each with quad set Manual: passive stretch into DF bil and hamstring stretch to pt tolerance  Seated: long arc quad 2x10 each-8 step under feet  Sidelying:  Clam with TA activation 2x10 Prone: Passive hip flexor stretch: pillow under abdomen with education on how to do at home.   04/18/24 Reclined on wedge:  Quad set: with towel roll under knee- emphasis on DF   Supine hamstring stretch: 3x15 seconds  Short arc quad with foam roll under knees 5 hold x10  Straight leg raise 2x5 each with quad set Seated 1/2 foam roll: DF/PF and Inversion/eversion - showed pt how to use opposite limb for overpressure on roll Manual: passive stretch into DF bil and hamstring stretch to pt tolerance  Seated: long arc quad x10 each  Sidelying:  Clam with TA activation  2x10 Standing:  Gastroc stretch using 1/2 foam roll against wall and UE support on walker to maintain 50# weightbearing Weight shift to  encourage heel to ground for stretch (50#) and UE support    04/16/24 Reclined on wedge:  Ankle pumps x 20 bil Ankle DF stretch with strap: 2x20 seconds bil  Supine hamstring stretch: 3x15 seconds  Short arc quad with green pool noodle under knees 5 hold x10  Straight leg raise 2x5 each with quad set Seated 1/2 foam roll: DF/PF and Inversion/eversion - showed pt how to use opposite limb for overpressure on roll Manual: passive stretch into DF bil and hamstring stretch to pt tolerance   04/09/24 Initial eval completed and initiated HEP    PATIENT EDUCATION:  Education details: Initiated HEP Person educated: Patient and Parent Education method: Programmer, Multimedia, Facilities Manager, Verbal cues, and Handouts Education comprehension: verbalized understanding, returned demonstration, and verbal cues required  HOME EXERCISE PROGRAM: Access Code: [email or web address] URL: [email or web address] Date: 04/16/2024 Prepared by: Burnard  Exercises - Seated Ankle Alphabet  - 1 x daily - 7 x weekly - 1 sets - 1 reps - a-z hold - Standing Bilateral Gastroc Stretch with Step  - 1 x daily - 7 x weekly - 1 sets - 10 reps - 10 sec hold - Seated Ankle Inversion Eversion AROM  - 1 x daily - 7 x weekly - 1 sets - 10 reps - 5 sec each side hold - Long Sitting Calf Stretch with Strap  - 1 x daily - 7 x weekly - 1 sets - 10 reps - 10 sec hold - Supine Hamstring Stretch with Strap  - 2 x daily - 7 x weekly - 1 sets - 5 reps - 15 hold - Supine Short Arc Quad  - 2 x daily - 7 x weekly - 2 sets - 10 reps - 5 hold - Supine Active Straight Leg Raise  - 2 x daily - 7 x weekly - 1-2 sets - 5 reps  ASSESSMENT:  CLINICAL IMPRESSION: Pt reports minimal compliance with HEP and wear of brace for knee extension.  PT educated pt regarding importance of compliance for gains.    Pt is more  challenged with SLR and quad set on the Lt today.  PT monitored throughout session for cueing and advancement as appropriate.   She would benefit from skilled PT to avoid contractures and muscle atrophy due to wb restrictions for a prolonged period of time.     OBJECTIVE IMPAIRMENTS: Abnormal gait, decreased balance, decreased mobility, difficulty walking, decreased ROM, decreased strength, increased fascial restrictions, increased muscle spasms, impaired flexibility, postural dysfunction, and pain.   ACTIVITY LIMITATIONS: carrying, lifting, bending, standing, squatting, stairs, transfers, bed mobility, bathing, toileting, dressing, and hygiene/grooming  PARTICIPATION LIMITATIONS: meal prep, cleaning, laundry, driving, shopping, and community activity  PERSONAL FACTORS: Past/current experiences and 1 comorbidity: achondroplasia are also affecting patient's functional outcome.   REHAB POTENTIAL: Good  CLINICAL DECISION MAKING: Stable/uncomplicated  EVALUATION COMPLEXITY: Low   GOALS: Goals reviewed with patient? Yes  SHORT TERM GOALS: Target date: 05/21/2024  Pain report to be no greater than 4/10  Baseline:no pain reported (04/23/24) Goal status: MET  2.  Patient will be independent with initial HEP  Baseline:  Goal status: In progress (04/16/24)   LONG TERM GOALS: Target date: 07/02/2024  Patient to report pain no greater than 2/10  Baseline:  Goal status: INITIAL  2.  Patient to be independent with advanced HEP  Baseline:  Goal status: INITIAL  3.  Increased left ankle ROM to [facility] Baseline:  Goal status: INITIAL  4.  Increase left knee extension to 0 degrees Baseline:  Goal status: INITIAL  5.  Patient to be able to stand or walk for at least 15 min without leg pain  Baseline:  Goal status: INITIAL  6.  Patient to be able to ambulate 300 feet without assistive device with normal heel to toe progresseion Baseline:  Goal status: INITIAL   PLAN:  PT FREQUENCY:  1-2x/week  PT DURATION: 12 weeks  PLANNED INTERVENTIONS: 97110-Therapeutic exercises, 97530- Therapeutic activity, 97112- Neuromuscular re-education, 97535- Self Care, 02859- Manual therapy, [phone]- Gait training, [phone]- Aquatic Therapy, [phone]- Electrical stimulation (unattended), [phone]- Electrical stimulation (manual), [id number]- Vasopneumatic device, [id number]- Ionotophoresis 4mg /ml Dexamethasone , 79439 (1-2 muscles), 20561 (3+ muscles)- Dry Needling, Patient/Family education, Balance training, Stair training, Taping, Joint mobilization, Scar mobilization, DME instructions, Wheelchair mobility training, Cryotherapy, and Moist heat  PLAN FOR NEXT SESSION: focus on ankle ROM, osteotomy protocol, patient is allowed 50 lbs of weight bearing, quad rehab.     Burnard Joy, PT 04/23/2024 12:15 PM  [facility] [address], [address] [place], [place] [address] Phone # [phone] Fax [phone]  "

## 2024-04-25 ENCOUNTER — Ambulatory Visit

## 2024-04-25 DIAGNOSIS — R262 Difficulty in walking, not elsewhere classified: Secondary | ICD-10-CM

## 2024-04-25 DIAGNOSIS — M25561 Pain in right knee: Secondary | ICD-10-CM

## 2024-04-25 DIAGNOSIS — M6281 Muscle weakness (generalized): Secondary | ICD-10-CM

## 2024-04-25 DIAGNOSIS — M25572 Pain in left ankle and joints of left foot: Secondary | ICD-10-CM

## 2024-04-25 DIAGNOSIS — M25562 Pain in left knee: Secondary | ICD-10-CM

## 2024-04-25 DIAGNOSIS — M25571 Pain in right ankle and joints of right foot: Secondary | ICD-10-CM

## 2024-04-25 NOTE — Therapy (Signed)
 " OUTPATIENT PHYSICAL THERAPY TREATMENT   Patient Name: Andrea Harrison MRN: [id number] DOB:Sep 28, 1995, 29 y.o., female Today's Date: 04/25/2024  END OF SESSION:  PT End of Session - 04/25/24 1022     Visit Number 5    Date for Recertification  06/04/24    Authorization Type BCBS    Authorization - Visit Number 5    Authorization - Number of Visits 30    Progress Note Due on Visit 10    PT Start Time 0932    PT Stop Time 1014    PT Time Calculation (min) 42 min    Activity Tolerance Patient tolerated treatment well    Behavior During Therapy [facility] for tasks assessed/performed              History reviewed. No pertinent past medical history. 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  Rt femur is at max and is filling in.  Still using machine for lengthening on Lt femur and bil tibia. He wants us  to sent him measurements every couple of weeks.    From evaluation: Patient underwent tibal and femoral osteotomy during two separate surgeries.  First surgery was on 01/16/24 for right LE and second surgery was on left LE on 10/21.  She is allowed approx 75% weight bearing at this time.  She is able to transfer independently.  And use her walker for short distances.  She is accompanied by  her mother today.  She arrives in wheelchair.  She denies any significant pain, rating this at 1/2 out of 10.  She still has several weeks of lengthening schedule and will not likely be weight bearing until this schedule is complete and bone growth is completed.  However, MD would like for patient to participate in PT to avoid contractures and prepare for gait training.    PERTINENT HISTORY: Achondroplasia PAIN:  Are you having pain? Yes: NPRS scale: 0/10 Pain location: both legs Pain description: mild ache Aggravating factors: weight bearing Relieving factors: rest  PRECAUTIONS: None  RED FLAGS: None   WEIGHT BEARING RESTRICTIONS: Yes 50 lbs (about 75% wb)  FALLS:  Has patient fallen in last 6 months? No  LIVING ENVIRONMENT: Lives with: lives with their family Lives in: House/apartment  OCCUPATION: na  PLOF: Independent, Independent with basic ADLs, Independent with household mobility without device, Independent with community mobility without device, Independent with homemaking with ambulation, Independent with gait, and Independent with transfers  PATIENT GOALS: to resume walking  NEXT MD VISIT: every 2 weeks for xrays  OBJECTIVE:  Note: Objective measures were completed at Evaluation unless otherwise noted.  DIAGNOSTIC FINDINGS: none found for LE's  PATIENT SURVEYS:  LEFS  Extreme difficulty/unable (0), Quite a bit of difficulty (1), Moderate difficulty (2), Little  difficulty (3), No difficulty (4) Survey date:  04/09/24  Score total:  37/80     COGNITION: Overall cognitive status: Within functional limits for tasks assessed     SENSATION: WFL  POSTURE: achondroplasia  PALPATION: na  LOWER EXTREMITY ROM:  Passive ROM Right eval Right 04/25/24 Left eval Left  04/25/24  Hip flexion      Hip extension      Hip abduction      Hip adduction      Hip internal rotation      Hip external rotation      Knee flexion      Knee extension  0 -4 -4  Ankle  dorsiflexion 4 4 10 8   Ankle plantarflexion   excess   Ankle inversion  full    Ankle eversion 2   11   (Blank rows = not tested)  LOWER EXTREMITY MMT:   LOWER EXTREMITY SPECIAL TESTS:  na  FUNCTIONAL TESTS:  5 times sit to stand: will complete when patient is full WB Timed up and go (TUG): will complete when patient is full WB  TRANSFERS: Independent with all transfers using rolling walker.  May need assist with showering for safety.    GAIT: Distance walked: 5 feet Assistive device utilized: Walker - 2 wheeled Level of assistance: Complete Independence and SBA Comments: hop to gait                                                                                                                                TREATMENT DATE:  04/25/24 Reclined on wedge:  Short arc quads 2# added 2x10 with leg over foam roll   Supine hamstring stretch: 5x15 seconds-passive by PT  Seated: long arc quad 2x10 each-8 step under feet  Toes on foam pad and press down on knees for passive DF stretch bil    Manual: passive stretch and hold into ankle DF and eversion, hamstring stretch and knee extension    04/23/24 Reclined on wedge:  Quad set: with towel roll under knee- emphasis on DF   Supine hamstring stretch: 5x15 seconds-passive by PT Bridge: 5 hold x10 with TA activation  Straight leg raise 2x5 each with quad set Manual: passive stretch into DF bil and hamstring stretch to pt tolerance  Seated: long arc quad 2x10 each-8 step under feet  Sidelying:  Clam with TA activation 2x10 Prone: Passive hip flexor stretch: pillow under abdomen with education on how to do at home.   04/18/24 Reclined on wedge:  Quad set: with towel roll under knee- emphasis on DF   Supine hamstring stretch: 3x15 seconds  Short arc quad with foam roll under knees 5 hold x10  Straight leg raise 2x5 each with quad set Seated 1/2 foam roll: DF/PF and Inversion/eversion - showed pt how to use opposite limb for  overpressure on roll Manual: passive stretch into DF bil and hamstring stretch to pt tolerance  Seated: long  arc quad x10 each  Sidelying:  Clam with TA activation 2x10 Standing:  Gastroc stretch using 1/2 foam roll against wall and UE support on walker to maintain 50# weightbearing Weight shift to encourage heel to ground for stretch (50#) and UE support      PATIENT EDUCATION:  Education details: Initiated HEP Person educated: Patient and Parent Education method: Programmer, Multimedia, Facilities Manager, Verbal cues, and Handouts Education comprehension: verbalized understanding, returned demonstration, and verbal cues required  HOME EXERCISE PROGRAM: Access Code: [email or web address] URL: [email or web address] Date: 04/16/2024 Prepared by: Burnard  Exercises - Seated Ankle Alphabet  - 1 x daily - 7 x weekly - 1 sets - 1 reps - a-z hold - Standing Bilateral Gastroc Stretch with Step  - 1 x daily - 7 x weekly - 1 sets - 10 reps - 10 sec hold - Seated Ankle Inversion Eversion AROM  - 1 x daily - 7 x weekly - 1 sets - 10 reps - 5 sec each side hold - Long Sitting Calf Stretch with Strap  - 1 x daily - 7 x weekly - 1 sets - 10 reps - 10 sec hold - Supine Hamstring Stretch with Strap  - 2 x daily - 7 x weekly - 1 sets - 5 reps - 15 hold - Supine Short Arc Quad  - 2 x daily - 7 x weekly - 2 sets - 10 reps - 5 hold - Supine Active Straight Leg Raise  - 2 x daily - 7 x weekly - 1-2 sets - 5 reps  ASSESSMENT:  CLINICAL IMPRESSION: Pt reports that she has been doing more stretching since last visit including passive knee extension stretch, use of foam roll for ankle mobility and hamstring/gastroc stretch with strap. Majority of session spent with passive stretching to ankles and knees.  Measures taken and minimal changes noted today.   PT monitored throughout session for cueing and advancement as appropriate.   She would benefit from skilled PT to avoid contractures and muscle atrophy due to wb  restrictions for a prolonged period of time.     OBJECTIVE IMPAIRMENTS: Abnormal gait, decreased balance, decreased mobility, difficulty walking, decreased ROM, decreased strength, increased fascial restrictions, increased muscle spasms, impaired flexibility, postural dysfunction, and pain.   ACTIVITY LIMITATIONS: carrying, lifting, bending, standing, squatting, stairs, transfers, bed mobility, bathing, toileting, dressing, and hygiene/grooming  PARTICIPATION LIMITATIONS: meal prep, cleaning, laundry, driving, shopping, and community activity  PERSONAL FACTORS: Past/current experiences and 1 comorbidity: achondroplasia are also affecting patient's functional outcome.   REHAB POTENTIAL: Good  CLINICAL DECISION MAKING: Stable/uncomplicated  EVALUATION COMPLEXITY: Low   GOALS: Goals reviewed with patient? Yes  SHORT TERM GOALS: Target date: 05/21/2024  Pain report to be no greater than 4/10  Baseline:no pain reported (04/23/24) Goal status: MET  2.  Patient will be independent with initial HEP  Baseline:  Goal status: In progress (04/16/24)   LONG TERM GOALS: Target date: 07/02/2024  Patient to report pain no greater than 2/10  Baseline:  Goal status: INITIAL  2.  Patient to be independent with advanced HEP  Baseline:  Goal status: INITIAL  3.  Increased left ankle ROM to [facility] Baseline:  Goal status: INITIAL  4.  Increase left knee extension to 0 degrees Baseline:  Goal status: INITIAL  5.  Patient to be able to stand or walk for at least 15 min without leg pain  Baseline:  Goal status: INITIAL  6.  Patient to be able to ambulate 300 feet without assistive device  with normal heel to toe progresseion Baseline:  Goal status: INITIAL   PLAN:  PT FREQUENCY: 1-2x/week  PT DURATION: 12 weeks  PLANNED INTERVENTIONS: 97110-Therapeutic exercises, 97530- Therapeutic activity, 97112- Neuromuscular re-education, 97535- Self Care, 02859- Manual therapy, [phone]- Gait training,  [phone]- Aquatic Therapy, [phone]- Electrical stimulation (unattended), [phone]- Electrical stimulation (manual), [id number]- Vasopneumatic device, [id number]- Ionotophoresis 4mg /ml Dexamethasone , 79439 (1-2 muscles), 20561 (3+ muscles)- Dry Needling, Patient/Family education, Balance training, Stair training, Taping, Joint mobilization, Scar mobilization, DME instructions, Wheelchair mobility training, Cryotherapy, and Moist heat  PLAN FOR NEXT SESSION: focus on ankle ROM, osteotomy protocol, patient is allowed 50 lbs of weight bearing, quad rehab.  Send measurements to the MD on 05/06/24 per his request   Burnard Joy, PT 04/25/24 10:23 AM  [facility] [address], [address] [place], [place] [address] Phone # [phone] Fax [phone]  "

## 2024-04-30 ENCOUNTER — Ambulatory Visit

## 2024-04-30 DIAGNOSIS — R293 Abnormal posture: Secondary | ICD-10-CM

## 2024-04-30 DIAGNOSIS — M25561 Pain in right knee: Secondary | ICD-10-CM

## 2024-04-30 DIAGNOSIS — R252 Cramp and spasm: Secondary | ICD-10-CM

## 2024-04-30 DIAGNOSIS — M25562 Pain in left knee: Secondary | ICD-10-CM

## 2024-04-30 DIAGNOSIS — M6281 Muscle weakness (generalized): Secondary | ICD-10-CM

## 2024-04-30 DIAGNOSIS — M25572 Pain in left ankle and joints of left foot: Secondary | ICD-10-CM

## 2024-04-30 DIAGNOSIS — R262 Difficulty in walking, not elsewhere classified: Secondary | ICD-10-CM

## 2024-04-30 DIAGNOSIS — M25571 Pain in right ankle and joints of right foot: Secondary | ICD-10-CM

## 2024-04-30 NOTE — Therapy (Signed)
 " OUTPATIENT PHYSICAL THERAPY TREATMENT   Patient Name: Andrea Harrison MRN: [id number] DOB:07/19/1995, 29 y.o., female Today's Date: 04/30/2024  END OF SESSION:  PT End of Session - 04/30/24 1144     Visit Number 6    Number of Visits 15    Date for Recertification  06/04/24    Authorization Type BCBS    Authorization Time Period 30 per condition? has used 15 to date    Authorization - Visit Number 6    Authorization - Number of Visits 30    Progress Note Due on Visit 10    PT Start Time 1145    PT Stop Time 1230    PT Time Calculation (min) 45 min    Activity Tolerance Patient tolerated treatment well    Behavior During Therapy [facility] for tasks assessed/performed              History reviewed. No pertinent past medical history. 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  Rt femur is at max and is filling in.  Still using machine for lengthening on Lt femur and bil tibia. He wants us  to sent him measurements every couple of weeks.    From evaluation: Patient underwent tibal and femoral osteotomy during two separate surgeries.  First surgery was on 01/16/24 for right LE and second surgery was on left LE on 10/21.  She is allowed approx 75%  weight bearing at this time.  She is able to transfer independently.  And use her walker for short distances.  She is accompanied by her mother today.  She arrives in wheelchair.  She denies any significant pain, rating this at 1/2 out of 10.  She still has several weeks of lengthening schedule and will not likely be weight bearing until this schedule is complete and bone growth is completed.  However, MD would like for patient to participate in PT to avoid contractures and prepare for gait training.    PERTINENT HISTORY: Achondroplasia PAIN:  Are you having pain? Yes: NPRS scale: 0/10 Pain location: both legs Pain description: mild ache Aggravating factors: weight bearing Relieving factors: rest  PRECAUTIONS: None  RED FLAGS: None   WEIGHT BEARING RESTRICTIONS: Yes 50 lbs (about 75% wb)  FALLS:  Has patient fallen in last 6 months? No  LIVING ENVIRONMENT: Lives with: lives with their family Lives in: House/apartment  OCCUPATION: na  PLOF: Independent, Independent with basic ADLs, Independent with household mobility without device, Independent with community mobility without device, Independent with homemaking with ambulation, Independent with gait, and Independent with transfers  PATIENT GOALS: to resume walking  NEXT MD VISIT: every 2 weeks for xrays  OBJECTIVE:  Note: Objective measures were completed at Evaluation unless  otherwise noted.  DIAGNOSTIC FINDINGS: none found for LE's  PATIENT SURVEYS:  LEFS  Extreme difficulty/unable (0), Quite a bit of difficulty (1), Moderate difficulty (2), Little difficulty (3), No difficulty (4) Survey date:  04/09/24  Score total:  37/80     COGNITION: Overall cognitive status: Within functional limits for tasks assessed     SENSATION: WFL  POSTURE: achondroplasia  PALPATION: na  LOWER EXTREMITY ROM:  Passive ROM Right eval Right 04/25/24 Left eval Left  04/25/24  Hip flexion      Hip extension      Hip abduction       Hip adduction      Hip internal rotation      Hip external rotation      Knee flexion      Knee extension  0 -4 -4  Ankle dorsiflexion 4 4 10 8   Ankle plantarflexion   excess   Ankle inversion  full    Ankle eversion 2   11   (Blank rows = not tested)  LOWER EXTREMITY MMT:   LOWER EXTREMITY SPECIAL TESTS:  na  FUNCTIONAL TESTS:  5 times sit to stand: will complete when patient is full WB Timed up and go (TUG): will complete when patient is full WB  TRANSFERS: Independent with all transfers using rolling walker.  May need assist with showering for safety.    GAIT: Distance walked: 5 feet Assistive device utilized: Walker - 2 wheeled Level of assistance: Complete Independence and SBA Comments: hop to gait                                                                                                                                TREATMENT DATE:  04/30/24 Reclined on wedge:  Quad sets 2 x 20 Short arc quads 2# added 2x10 with leg over foam roll  Supine SLR 2 x 10 each LE Supine hamstring stretch: 5x15 seconds-passive by PT Manual: passive stretch and hold into ankle DF and eversion, hamstring stretch and knee extension Seated: long arc quad 2x10 each-8 step under feet  Self stretch using strap x 5 holding 10 sec each  Standing on edge of foam in walker, calf stretch x 10 holding 10 sec   04/25/24 Reclined on wedge:  Short arc quads 2# added 2x10 with leg over foam roll   Supine hamstring stretch: 5x15 seconds-passive by PT  Seated: long arc quad 2x10 each-8 step under feet  Toes on foam pad and press down on knees for passive DF stretch bil    Manual: passive stretch and hold into ankle DF and eversion, hamstring stretch and knee extension    04/23/24 Reclined on wedge:  Quad set: with towel roll under knee- emphasis on DF   Supine hamstring stretch: 5x15 seconds-passive by PT Bridge: 5 hold x10 with TA activation  Straight leg raise 2x5 each with quad  set Manual: passive stretch into DF bil and hamstring stretch  to pt tolerance  Seated: long arc quad 2x10 each-8 step under feet  Sidelying:  Clam with TA activation 2x10 Prone: Passive hip flexor stretch: pillow under abdomen with education on how to do at home.      PATIENT EDUCATION:  Education details: Initiated HEP Person educated: Patient and Parent Education method: Programmer, Multimedia, Facilities Manager, Verbal cues, and Handouts Education comprehension: verbalized understanding, returned demonstration, and verbal cues required  HOME EXERCISE PROGRAM: Access Code: [email or web address] URL: [email or web address] Date: 04/16/2024 Prepared by: Burnard  Exercises - Seated Ankle Alphabet  - 1 x daily - 7 x weekly - 1 sets - 1 reps - a-z hold - Standing Bilateral Gastroc Stretch with Step  - 1 x daily - 7 x weekly - 1 sets - 10 reps - 10 sec hold - Seated Ankle Inversion Eversion AROM  - 1 x daily - 7 x weekly - 1 sets - 10 reps - 5 sec each side hold - Long Sitting Calf Stretch with Strap  - 1 x daily - 7 x weekly - 1 sets - 10 reps - 10 sec hold - Supine Hamstring Stretch with Strap  - 2 x daily - 7 x weekly - 1 sets - 5 reps - 15 hold - Supine Short Arc Quad  - 2 x daily - 7 x weekly - 2 sets - 10 reps - 5 hold - Supine Active Straight Leg Raise  - 2 x daily - 7 x weekly - 1-2 sets - 5 reps  ASSESSMENT:  CLINICAL IMPRESSION: Patient is progressing slowly with regard to ankle ROM and knee extension.  She has completed lengthening on right LE .  She has a few more weeks on the left.  She has no pain with any of the manual stretching and ROM.  She continues to be tight in knee extension and ankle DF.  She is well motivated and compliance is improving.   She would benefit from skilled PT to avoid contractures and muscle atrophy due to wb restrictions for a prolonged period of time.     OBJECTIVE IMPAIRMENTS: Abnormal gait, decreased balance, decreased mobility, difficulty walking,  decreased ROM, decreased strength, increased fascial restrictions, increased muscle spasms, impaired flexibility, postural dysfunction, and pain.   ACTIVITY LIMITATIONS: carrying, lifting, bending, standing, squatting, stairs, transfers, bed mobility, bathing, toileting, dressing, and hygiene/grooming  PARTICIPATION LIMITATIONS: meal prep, cleaning, laundry, driving, shopping, and community activity  PERSONAL FACTORS: Past/current experiences and 1 comorbidity: achondroplasia are also affecting patient's functional outcome.   REHAB POTENTIAL: Good  CLINICAL DECISION MAKING: Stable/uncomplicated  EVALUATION COMPLEXITY: Low   GOALS: Goals reviewed with patient? Yes  SHORT TERM GOALS: Target date: 05/21/2024  Pain report to be no greater than 4/10  Baseline:no pain reported (04/23/24) Goal status: MET  2.  Patient will be independent with initial HEP  Baseline:  Goal status: In progress (04/16/24)   LONG TERM GOALS: Target date: 07/02/2024  Patient to report pain no greater than 2/10  Baseline:  Goal status: INITIAL  2.  Patient to be independent with advanced HEP  Baseline:  Goal status: INITIAL  3.  Increased left ankle ROM to [facility] Baseline:  Goal status: INITIAL  4.  Increase left knee extension to 0 degrees Baseline:  Goal status: INITIAL  5.  Patient to be able to stand or walk for at least 15 min without leg pain  Baseline:  Goal status: INITIAL  6.  Patient to be able to ambulate 300 feet without assistive device with normal  heel to toe progresseion Baseline:  Goal status: INITIAL   PLAN:  PT FREQUENCY: 1-2x/week  PT DURATION: 12 weeks  PLANNED INTERVENTIONS: 97110-Therapeutic exercises, 97530- Therapeutic activity, 97112- Neuromuscular re-education, 97535- Self Care, 02859- Manual therapy, [phone]- Gait training, [phone]- Aquatic Therapy, [phone]- Electrical stimulation (unattended), [phone]- Electrical stimulation (manual), [id number]- Vasopneumatic device, [id number]-  Ionotophoresis 4mg /ml Dexamethasone , 20560 (1-2 muscles), 20561 (3+ muscles)- Dry Needling, Patient/Family education, Balance training, Stair training, Taping, Joint mobilization, Scar mobilization, DME instructions, Wheelchair mobility training, Cryotherapy, and Moist heat  PLAN FOR NEXT SESSION: focus on ankle ROM, osteotomy protocol, patient is allowed 50 lbs of weight bearing, quad rehab.  Send measurements to the MD on 05/06/24 per his request   Delon B. Annebelle Bostic, PT 04/30/24 12:52 PM [facility] [address] [place], [place] [address] Phone # [phone] Fax [phone]  "

## 2024-05-02 ENCOUNTER — Ambulatory Visit

## 2024-05-02 ENCOUNTER — Other Ambulatory Visit: Payer: Self-pay | Admitting: Orthopedic Surgery

## 2024-05-02 ENCOUNTER — Ambulatory Visit
Admission: RE | Admit: 2024-05-02 | Discharge: 2024-05-02 | Disposition: A | Discharge status: Home / Self Care | Source: Ambulatory Visit | Attending: Orthopedic Surgery | Admitting: Orthopedic Surgery

## 2024-05-02 DIAGNOSIS — R262 Difficulty in walking, not elsewhere classified: Secondary | ICD-10-CM

## 2024-05-02 DIAGNOSIS — M25571 Pain in right ankle and joints of right foot: Secondary | ICD-10-CM

## 2024-05-02 DIAGNOSIS — Z9889 Other specified postprocedural states: Secondary | ICD-10-CM

## 2024-05-02 DIAGNOSIS — M25562 Pain in left knee: Secondary | ICD-10-CM

## 2024-05-02 DIAGNOSIS — M6281 Muscle weakness (generalized): Secondary | ICD-10-CM

## 2024-05-02 DIAGNOSIS — M25561 Pain in right knee: Secondary | ICD-10-CM

## 2024-05-02 DIAGNOSIS — M25572 Pain in left ankle and joints of left foot: Secondary | ICD-10-CM

## 2024-05-02 NOTE — Therapy (Signed)
 " OUTPATIENT PHYSICAL THERAPY TREATMENT   Patient Name: Andrea Harrison MRN: [id number] DOB:[birth year], 29 y.o., female Today's Date: 05/02/2024  END OF SESSION:  PT End of Session - 05/02/24 1231     Visit Number 7    Date for Recertification  06/04/24    Authorization Type BCBS    Authorization - Visit Number 7    Authorization - Number of Visits 30    PT Start Time 1147    PT Stop Time 1228    PT Time Calculation (min) 41 min    Activity Tolerance Patient tolerated treatment well    Behavior During Therapy [facility] for tasks assessed/performed               History reviewed. No pertinent past medical history. 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  I was sore from last session.    From evaluation: Patient underwent tibal and femoral osteotomy during two separate surgeries.  First surgery was on 01/16/24 for right LE and second surgery was on left LE on 10/21.  She is allowed approx 75% weight bearing at this time.  She is able to transfer independently.  And use her walker for short distances.  She is accompanied by her mother today.  She arrives in wheelchair.  She denies any significant pain, rating this at 1/2 out of 10.  She still has several weeks of lengthening schedule and will not  likely be weight bearing until this schedule is complete and bone growth is completed.  However, MD would like for patient to participate in PT to avoid contractures and prepare for gait training.    PERTINENT HISTORY: Achondroplasia PAIN:  Are you having pain? Yes: NPRS scale: 0/10 Pain location: both legs Pain description: mild ache Aggravating factors: weight bearing Relieving factors: rest  PRECAUTIONS: None  RED FLAGS: None   WEIGHT BEARING RESTRICTIONS: Yes 50 lbs (about 75% wb)  FALLS:  Has patient fallen in last 6 months? No  LIVING ENVIRONMENT: Lives with: lives with their family Lives in: House/apartment  OCCUPATION: na  PLOF: Independent, Independent with basic ADLs, Independent with household mobility without device, Independent with community mobility without device, Independent with homemaking with ambulation, Independent with gait, and Independent with transfers  PATIENT GOALS: to resume walking  NEXT MD VISIT: every 2 weeks for xrays  OBJECTIVE:  Note: Objective measures were completed at Evaluation unless otherwise noted.  DIAGNOSTIC FINDINGS: none found for LE's  PATIENT SURVEYS:  LEFS  Extreme difficulty/unable (0), Quite a bit of difficulty (1), Moderate difficulty (2), Little difficulty (3), No difficulty (4) Survey date:  04/09/24  Score total:  37/80     COGNITION: Overall cognitive status: Within functional limits for tasks assessed     SENSATION: WFL  POSTURE: achondroplasia  PALPATION: na  LOWER EXTREMITY ROM:  Passive ROM Right eval Right 04/25/24 Left eval Left  04/25/24  Hip flexion      Hip extension      Hip abduction      Hip adduction      Hip internal rotation      Hip external rotation      Knee flexion      Knee extension  0 -4 -4  Ankle dorsiflexion 4 4 10 8   Ankle plantarflexion   excess   Ankle inversion  full    Ankle eversion 2   11   (Blank rows = not tested)  LOWER EXTREMITY MMT:   LOWER EXTREMITY  SPECIAL TESTS:  na  FUNCTIONAL TESTS:  5 times sit to stand: will complete when patient is full WB Timed up and go (TUG): will complete when patient is full WB  TRANSFERS: Independent with all transfers using rolling walker.  May need assist with showering for safety.    GAIT: Distance walked: 5 feet Assistive device utilized: Walker - 2 wheeled Level of assistance: Complete Independence and SBA Comments: hop to gait                                                                                                                                TREATMENT DATE:  05/02/24 Reclined on wedge:  Short arc quads 2# added 2x10 with leg over foam roll  Supine SLR 2 x 10 each LE Supine hamstring stretch: 5x15 seconds-passive by PT Manual: passive stretch and hold into ankle DF and eversion, hamstring stretch and knee extension Seated: long arc quad 2x10 each-8 step under feet  Self stretch using strap x 5 holding 10 sec each-seated with foot on 8 step Standing on edge of foam in walker, calf stretch x 10 holding 10 sec Seated:  Long arc quads: 2# added 2x10 Self stretch using strap x 5 holding 10 sec each-seated with foot on 8 step Hamstring curl with blue loop x15  04/30/24 Reclined on wedge:  Quad sets 2 x 20 Short arc quads 2# added 2x10 with leg over foam roll  Supine SLR 2 x 10 each LE Supine hamstring stretch: 5x15 seconds-passive by PT Manual: passive stretch and hold into ankle DF and eversion, hamstring stretch and knee extension Seated: long arc quad 2x10 each-8 step under feet  Self stretch using strap x 5 holding 10 sec each  Standing on edge of foam in walker, calf stretch x 10 holding 10 sec   04/25/24 Reclined on wedge:  Short arc quads 2# added 2x10 with leg over foam roll   Supine hamstring stretch: 5x15 seconds-passive by PT  Seated: long arc quad 2x10 each-8 step under feet  Toes on foam pad and press down on knees for passive DF stretch bil    Manual:  passive stretch and hold into ankle DF and eversion, hamstring stretch and knee extension  04/23/24 Reclined on wedge:  Quad set: with towel roll under knee- emphasis on DF   Supine hamstring stretch: 5x15 seconds-passive by PT Bridge: 5 hold x10 with TA activation  Straight leg raise 2x5 each with quad set Manual: passive stretch into DF bil and hamstring stretch to pt tolerance  Seated: long arc quad 2x10 each-8 step under feet  Sidelying:  Clam with TA activation 2x10 Prone: Passive hip flexor stretch: pillow under abdomen with education on how to do at home.      PATIENT EDUCATION:  Education details: Initiated HEP Person educated: Patient and Parent Education method: Programmer, Multimedia, Facilities Manager, Verbal cues, and Handouts Education comprehension: verbalized understanding, returned demonstration, and verbal cues required  HOME EXERCISE PROGRAM: Access Code: [email or web address] URL: [email or web address] Date: 04/16/2024 Prepared by: Burnard  Exercises - Seated Ankle Alphabet  - 1 x daily - 7 x weekly - 1 sets - 1 reps - a-z hold - Standing Bilateral Gastroc Stretch with Step  - 1 x daily - 7 x weekly - 1 sets - 10 reps - 10 sec hold - Seated Ankle Inversion Eversion AROM  - 1 x daily - 7 x weekly - 1 sets - 10 reps - 5 sec each side hold - Long Sitting Calf Stretch with Strap  - 1 x daily - 7 x weekly - 1 sets - 10 reps - 10 sec hold - Supine Hamstring Stretch with Strap  - 2 x daily - 7 x weekly - 1 sets - 5 reps - 15 hold - Supine Short Arc Quad  - 2 x daily - 7 x weekly - 2 sets - 10 reps - 5 hold - Supine Active Straight Leg Raise  - 2 x daily - 7 x weekly - 1-2 sets - 5 reps  ASSESSMENT:  CLINICAL IMPRESSION: Patient is progressing slowly with regard to ankle ROM and knee extension.  She has completed lengthening on right LE and has x-rays today to monitor her Lt leg.   She has no pain with any of the manual stretching and ROM and reports mild soreness after last  session.  She continues to be tight in knee extension and ankle DF.  She is well motivated and compliance  has improved over the past week.   She would benefit from skilled PT to avoid contractures and muscle atrophy due to wb restrictions for a prolonged period of time.     OBJECTIVE IMPAIRMENTS: Abnormal gait, decreased balance, decreased mobility, difficulty walking, decreased ROM, decreased strength, increased fascial restrictions, increased muscle spasms, impaired flexibility, postural dysfunction, and pain.   ACTIVITY LIMITATIONS: carrying, lifting, bending, standing, squatting, stairs, transfers, bed mobility, bathing, toileting, dressing, and hygiene/grooming  PARTICIPATION LIMITATIONS: meal prep, cleaning, laundry, driving, shopping, and community activity  PERSONAL FACTORS: Past/current experiences and 1 comorbidity: achondroplasia are also affecting patient's functional outcome.   REHAB POTENTIAL: Good  CLINICAL DECISION MAKING: Stable/uncomplicated  EVALUATION COMPLEXITY: Low   GOALS: Goals reviewed with patient? Yes  SHORT TERM GOALS: Target date: 05/21/2024  Pain report to be no greater than 4/10  Baseline:no pain reported (04/23/24) Goal status: MET  2.  Patient will be independent with initial HEP  Baseline:  Goal status: In progress (04/16/24)   LONG TERM GOALS: Target date: 07/02/2024  Patient to report pain no greater than 2/10  Baseline:  Goal status: INITIAL  2.  Patient to be independent with advanced HEP  Baseline:  Goal status: INITIAL  3.  Increased left ankle ROM to [facility] Baseline:  Goal status: INITIAL  4.  Increase left knee extension to 0 degrees Baseline:  Goal status: INITIAL  5.  Patient to be able to stand or walk for at least 15 min without leg pain  Baseline:  Goal status: INITIAL  6.  Patient to be able to ambulate 300 feet without assistive device with normal heel to toe progresseion Baseline:  Goal status: INITIAL   PLAN:  PT  FREQUENCY: 1-2x/week  PT DURATION: 12 weeks  PLANNED INTERVENTIONS: 97110-Therapeutic exercises, 97530- Therapeutic activity, 97112- Neuromuscular re-education, 97535- Self Care, 02859- Manual therapy, [phone]- Gait training, [phone]- Aquatic Therapy, [phone]- Electrical stimulation (unattended), [phone]- Electrical stimulation (manual), [id number]- Vasopneumatic device, [id number]- Ionotophoresis 4mg /ml Dexamethasone , 79439 (1-2 muscles), 20561 (3+ muscles)- Dry Needling, Patient/Family education, Balance training, Stair training, Taping, Joint mobilization, Scar mobilization, DME instructions, Wheelchair mobility training, Cryotherapy, and Moist heat  PLAN FOR NEXT SESSION: focus on ankle ROM, osteotomy protocol, patient is allowed 50 lbs of weight bearing, quad rehab.  Send measurements to the MD on 05/06/24 per his request   Burnard Joy, PT 05/02/24 12:33 PM  [facility] [address] [place], [place] [address] Phone # [phone] Fax [phone]  "

## 2024-05-07 ENCOUNTER — Ambulatory Visit

## 2024-05-07 DIAGNOSIS — R262 Difficulty in walking, not elsewhere classified: Secondary | ICD-10-CM

## 2024-05-07 DIAGNOSIS — R252 Cramp and spasm: Secondary | ICD-10-CM

## 2024-05-07 DIAGNOSIS — M25572 Pain in left ankle and joints of left foot: Secondary | ICD-10-CM

## 2024-05-07 DIAGNOSIS — M25562 Pain in left knee: Secondary | ICD-10-CM

## 2024-05-07 DIAGNOSIS — R293 Abnormal posture: Secondary | ICD-10-CM

## 2024-05-07 DIAGNOSIS — M25571 Pain in right ankle and joints of right foot: Secondary | ICD-10-CM

## 2024-05-07 DIAGNOSIS — M25561 Pain in right knee: Secondary | ICD-10-CM

## 2024-05-07 DIAGNOSIS — M6281 Muscle weakness (generalized): Secondary | ICD-10-CM

## 2024-05-07 NOTE — Therapy (Signed)
 " OUTPATIENT PHYSICAL THERAPY TREATMENT   Patient Name: Andrea Harrison MRN: [id number] DOB:09/10/1995, 29 y.o., female Today's Date: 05/07/2024  END OF SESSION:  PT End of Session - 05/07/24 1150     Visit Number 8    Number of Visits 15    Date for Recertification  06/04/24    Authorization Type BCBS    Authorization Time Period 30 per condition? has used 15 to date    Authorization - Visit Number 8    Authorization - Number of Visits 30    Progress Note Due on Visit 10    PT Start Time 1148    PT Stop Time 1230    PT Time Calculation (min) 42 min    Activity Tolerance Patient tolerated treatment well    Behavior During Therapy WFL for tasks assessed/performed               History reviewed. No pertinent past medical history. 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  A little sore in my right hip but I think it's because I laid on it all night.     From evaluation: Patient underwent tibal and femoral osteotomy during two separate surgeries.  First surgery was on 01/16/24 for right LE and second surgery was on left LE on 10/21.  She is allowed approx 75% weight bearing at this time.  She is able to transfer independently.  And use her walker for short  distances.  She is accompanied by her mother today.  She arrives in wheelchair.  She denies any significant pain, rating this at 1/2 out of 10.  She still has several weeks of lengthening schedule and will not likely be weight bearing until this schedule is complete and bone growth is completed.  However, MD would like for patient to participate in PT to avoid contractures and prepare for gait training.    PERTINENT HISTORY: Achondroplasia PAIN:  05/07/24 Are you having pain? Yes: NPRS scale: 0/10 Pain location: both legs Pain description: mild ache Aggravating factors: weight bearing Relieving factors: rest  PRECAUTIONS: None  RED FLAGS: None   WEIGHT BEARING RESTRICTIONS: Yes 50 lbs (about 75% wb)  FALLS:  Has patient fallen in last 6 months? No  LIVING ENVIRONMENT: Lives with: lives with their family Lives in: House/apartment  OCCUPATION: na  PLOF: Independent, Independent with basic ADLs, Independent with household mobility without device, Independent with community mobility without device, Independent with homemaking with ambulation, Independent with gait, and Independent with transfers  PATIENT GOALS: to resume walking  NEXT MD VISIT: every 2 weeks for xrays  OBJECTIVE:  Note: Objective measures were completed at Evaluation unless otherwise noted.  DIAGNOSTIC FINDINGS: none found for LE's  PATIENT SURVEYS:  LEFS  Extreme  difficulty/unable (0), Quite a bit of difficulty (1), Moderate difficulty (2), Little difficulty (3), No difficulty (4) Survey date:  04/09/24  Score total:  37/80     COGNITION: Overall cognitive status: Within functional limits for tasks assessed     SENSATION: WFL  POSTURE: achondroplasia  PALPATION: na  LOWER EXTREMITY ROM:  Passive ROM Right eval Right 04/25/24 Left eval Left  04/25/24  Hip flexion      Hip extension      Hip abduction      Hip adduction      Hip internal rotation      Hip external rotation      Knee  flexion      Knee extension  0 -4 -4  Ankle dorsiflexion 4 4 10 8   Ankle plantarflexion   excess   Ankle inversion  full    Ankle eversion 2   11   (Blank rows = not tested)  LOWER EXTREMITY MMT:   LOWER EXTREMITY SPECIAL TESTS:  na  FUNCTIONAL TESTS:  5 times sit to stand: will complete when patient is full WB Timed up and go (TUG): will complete when patient is full WB  TRANSFERS: Independent with all transfers using rolling walker.  May need assist with showering for safety.    GAIT: Distance walked: 5 feet Assistive device utilized: Walker - 2 wheeled Level of assistance: Complete Independence and SBA Comments: hop to gait                                                                                                                                TREATMENT DATE:  05/07/24 Reclined on wedge:  Short arc quads 2# added 2x10 with leg over foam roll  Supine SLR 2 x 10 each LE Supine hamstring stretch: 5x15 seconds-passive by PT Manual: passive stretch and hold into ankle DF and eversion, hamstring stretch and knee extension Seated: long arc quad 2x10 each-8 step under feet  Self stretch using strap x 5 holding 10 sec each-seated with foot on 8 step SLR 2 x 10 0# on each LE Supine hip abduction 2 x 10 with towel under heel  Seated:  Long arc quads: 2# added 2x10 Self stretch using strap x 5 holding 10 sec each-seated with foot on 8 step Hamstring curl with blue loop x15  05/02/24 Reclined on wedge:  Short arc quads 2# added 2x10 with leg over foam roll  Supine SLR 2 x 10 each LE Supine hamstring stretch: 5x15 seconds-passive by PT Manual: passive stretch and hold into ankle DF and eversion, hamstring stretch and knee extension Seated: long arc quad 2x10 each-8 step under feet  Self stretch using strap x 5 holding 10 sec each-seated with foot on 8 step Standing on edge of foam in walker, calf stretch x 10 holding 10 sec Seated:  Long arc quads: 2# added  2x10 Self stretch using strap x 5 holding 10 sec each-seated with  foot on 8 step Hamstring curl with blue loop x15  04/30/24 Reclined on wedge:  Quad sets 2 x 20 Short arc quads 2# added 2x10 with leg over foam roll  Supine SLR 2 x 10 each LE Supine hamstring stretch: 5x15 seconds-passive by PT Manual: passive stretch and hold into ankle DF and eversion, hamstring stretch and knee extension Seated: long arc quad 2x10 each-8 step under feet  Self stretch using strap x 5 holding 10 sec each  Standing on edge of foam in walker, calf stretch x 10 holding 10 sec    PATIENT EDUCATION:  Education details: Initiated HEP Person educated: Patient and Parent Education method: Programmer, Multimedia, Facilities Manager, Verbal cues, and Handouts Education comprehension: verbalized understanding, returned demonstration, and verbal cues required  HOME EXERCISE PROGRAM: Access Code: [email or web address] URL: [email or web address] Date: 04/16/2024 Prepared by: Burnard  Exercises - Seated Ankle Alphabet  - 1 x daily - 7 x weekly - 1 sets - 1 reps - a-z hold - Standing Bilateral Gastroc Stretch with Step  - 1 x daily - 7 x weekly - 1 sets - 10 reps - 10 sec hold - Seated Ankle Inversion Eversion AROM  - 1 x daily - 7 x weekly - 1 sets - 10 reps - 5 sec each side hold - Long Sitting Calf Stretch with Strap  - 1 x daily - 7 x weekly - 1 sets - 10 reps - 10 sec hold - Supine Hamstring Stretch with Strap  - 2 x daily - 7 x weekly - 1 sets - 5 reps - 15 hold - Supine Short Arc Quad  - 2 x daily - 7 x weekly - 2 sets - 10 reps - 5 hold - Supine Active Straight Leg Raise  - 2 x daily - 7 x weekly - 1-2 sets - 5 reps  ASSESSMENT:  CLINICAL IMPRESSION: Patient is still progressing slowly with regard to ankle ROM and knee extension.  We added SLR and supine hip abduction today.  She was able to do these with ease.   She needs encouragement to be consistent with her stretches.  Ankle mobility was slightly improved  passively today.   She would benefit from skilled PT to avoid contractures and muscle atrophy due to wb restrictions for a prolonged period of time.     OBJECTIVE IMPAIRMENTS: Abnormal gait, decreased balance, decreased mobility, difficulty walking, decreased ROM, decreased strength, increased fascial restrictions, increased muscle spasms, impaired flexibility, postural dysfunction, and pain.   ACTIVITY LIMITATIONS: carrying, lifting, bending, standing, squatting, stairs, transfers, bed mobility, bathing, toileting, dressing, and hygiene/grooming  PARTICIPATION LIMITATIONS: meal prep, cleaning, laundry, driving, shopping, and community activity  PERSONAL FACTORS: Past/current experiences and 1 comorbidity: achondroplasia are also affecting patient's functional outcome.   REHAB POTENTIAL: Good  CLINICAL DECISION MAKING: Stable/uncomplicated  EVALUATION COMPLEXITY: Low   GOALS: Goals reviewed with patient? Yes  SHORT TERM GOALS: Target date: 05/21/2024  Pain report to be no greater than 4/10  Baseline:no pain reported (04/23/24) Goal status: MET  2.  Patient will be independent with initial HEP  Baseline:  Goal status: In progress (04/16/24)   LONG TERM GOALS: Target date: 07/02/2024  Patient to report pain no greater than 2/10  Baseline:  Goal status: INITIAL  2.  Patient to be independent with advanced HEP  Baseline:  Goal status: INITIAL  3.  Increased left ankle ROM to [place] Baseline:  Goal status: INITIAL  4.  Increase left knee extension to 0 degrees Baseline:  Goal  status: INITIAL  5.  Patient to be able to stand or walk for at least 15 min without leg pain  Baseline:  Goal status: INITIAL  6.  Patient to be able to ambulate 300 feet without assistive device with normal heel to toe progresseion Baseline:  Goal status: INITIAL   PLAN:  PT FREQUENCY: 1-2x/week  PT DURATION: 12 weeks  PLANNED INTERVENTIONS: 97110-Therapeutic exercises, 97530- Therapeutic  activity, 97112- Neuromuscular re-education, 97535- Self Care, 02859- Manual therapy, [phone]- Gait training, [phone]- Aquatic Therapy, [phone]- Electrical stimulation (unattended), [phone]- Electrical stimulation (manual), [id number]- Vasopneumatic device, [id number]- Ionotophoresis 4mg /ml Dexamethasone , 79439 (1-2 muscles), 20561 (3+ muscles)- Dry Needling, Patient/Family education, Balance training, Stair training, Taping, Joint mobilization, Scar mobilization, DME instructions, Wheelchair mobility training, Cryotherapy, and Moist heat  PLAN FOR NEXT SESSION: focus on ankle ROM, osteotomy protocol, patient is allowed 50 lbs of weight bearing, quad rehab.  Send measurements to the MD on 05/06/24 per his request   Delon B. Bricelyn Freestone, PT 05/07/24 3:37 PM [facility] [address] [place], [place] [address] Phone # [phone] Fax [phone]  "

## 2024-05-09 ENCOUNTER — Ambulatory Visit

## 2024-05-09 ENCOUNTER — Telehealth: Payer: Self-pay | Admitting: Nurse Practitioner

## 2024-05-09 DIAGNOSIS — M25561 Pain in right knee: Secondary | ICD-10-CM | POA: Diagnosis not present

## 2024-05-09 DIAGNOSIS — M25571 Pain in right ankle and joints of right foot: Secondary | ICD-10-CM

## 2024-05-09 DIAGNOSIS — M6281 Muscle weakness (generalized): Secondary | ICD-10-CM

## 2024-05-09 DIAGNOSIS — M25572 Pain in left ankle and joints of left foot: Secondary | ICD-10-CM

## 2024-05-09 DIAGNOSIS — M25562 Pain in left knee: Secondary | ICD-10-CM

## 2024-05-09 DIAGNOSIS — R262 Difficulty in walking, not elsewhere classified: Secondary | ICD-10-CM

## 2024-05-09 NOTE — Therapy (Signed)
 " OUTPATIENT PHYSICAL THERAPY TREATMENT   Patient Name: Andrea Harrison MRN: [id number] DOB:[birth year], 29 y.o., female Today's Date: 05/09/2024  END OF SESSION:  PT End of Session - 05/09/24 1226     Visit Number 9    Date for Recertification  06/04/24    Authorization Time Period 30 per condition? has used 15 to date    Authorization - Visit Number 9    Authorization - Number of Visits 30    PT Start Time 1144    PT Stop Time 1225    PT Time Calculation (min) 41 min    Activity Tolerance Patient tolerated treatment well    Behavior During Therapy [facility] for tasks assessed/performed                History reviewed. No pertinent past medical history. 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     From evaluation: Patient underwent tibal and femoral osteotomy during two separate surgeries.  First surgery was on 01/16/24 for right LE and second surgery was on left LE on 10/21.  She is allowed approx 75% weight bearing at this time.  She is able to transfer independently.  And use her walker for short distances.  She is accompanied by her mother today.  She arrives in wheelchair.  She denies any significant pain, rating this at 1/2 out  of 10.  She still has several weeks of lengthening schedule and will not likely be weight bearing until this schedule is complete and bone growth is completed.  However, MD would like for patient to participate in PT to avoid contractures and prepare for gait training.    PERTINENT HISTORY: Achondroplasia PAIN:  05/09/24 Are you having pain? Yes: NPRS scale: 1/10 Pain location: both legs Pain description: mild ache Aggravating factors: weight bearing Relieving factors: rest  PRECAUTIONS: None  RED FLAGS: None   WEIGHT BEARING RESTRICTIONS: Yes 50 lbs (about 75% wb)  FALLS:  Has patient fallen in last 6 months? No  LIVING ENVIRONMENT: Lives with: lives with their family Lives in: House/apartment  OCCUPATION: na  PLOF: Independent, Independent with basic ADLs, Independent with household mobility without device, Independent with community mobility without device, Independent with homemaking with ambulation, Independent with gait, and Independent with transfers  PATIENT GOALS: to resume walking  NEXT MD VISIT: every 2 weeks for xrays  OBJECTIVE:  Note: Objective measures were completed at Evaluation unless otherwise noted.  DIAGNOSTIC FINDINGS: none found for LE's  PATIENT SURVEYS:  LEFS  Extreme difficulty/unable (0), Quite a bit of difficulty (1), Moderate difficulty (2), Little difficulty (3), No difficulty (4) Survey date:  04/09/24  Score total:  37/80     COGNITION: Overall cognitive  status: Within functional limits for tasks assessed     SENSATION: WFL  POSTURE: achondroplasia  PALPATION: na  LOWER EXTREMITY ROM:  Passive ROM Right eval Right 04/25/24 Right 05/09/24 Left eval Left  04/25/24 Left  05/09/24  Hip flexion        Hip extension        Hip abduction        Hip adduction        Hip internal rotation        Hip external rotation        Knee flexion        Knee extension  0 0 -4 -4 -2  Ankle dorsiflexion 4 4 10  (lacking) 10 8 10  (lacking)   Ankle plantarflexion    excess    Ankle inversion  full      Ankle eversion 2  10  11 11    (Blank rows = not tested)  LOWER EXTREMITY MMT:   LOWER EXTREMITY SPECIAL TESTS:  na  FUNCTIONAL TESTS:  5 times sit to stand: will complete when patient is full WB Timed up and go (TUG): will complete when patient is full WB  TRANSFERS: Independent with all transfers using rolling walker.  May need assist with showering for safety.    GAIT: Distance walked: 5 feet Assistive device utilized: Walker - 2 wheeled Level of assistance: Complete Independence and SBA Comments: hop to gait                                                                                                                                TREATMENT DATE:  05/09/24 Reclined on wedge:  Supine SLR 2 x 10 each LE Supine hamstring stretch: 5x15 seconds-passive by PT Manual: passive stretch and hold into ankle DF and eversion, hamstring stretch and knee extension Seated: long arc quad 2x10 each-8 step under feet 2# added  Self stretch using strap x 5 holding 10 sec each-seated with foot on 8 step SLR 2 x 10 0# on each LE  Seated:  Long arc quads: 2# added 2x10 Self stretch using strap x 5 holding 10 sec each-seated with foot on 8 step   05/07/24 Reclined on wedge:  Short arc quads 2# added 2x10 with leg over foam roll  Supine SLR 2 x 10 each LE Manual: passive stretch and hold into ankle DF and eversion, hamstring stretch and knee extension with gentle overpressure Seated: long arc quad 2x10 each-8 step under feet  Self stretch using strap x 5 holding 10 sec each-seated with foot on 8 step SLR 2 x 10 0# on each LE Supine hip abduction 2 x 10 with towel under heel  Seated:  Long arc quads: 2# added 2x10 Self stretch using strap x 5 holding 10 sec each-seated with foot on 8 step Hamstring curl with blue loop x15  05/02/24 Reclined on wedge:  Short arc quads 2# added 2x10 with leg over foam roll  Supine  SLR 2 x 10 each LE Supine hamstring stretch: 5x15 seconds-passive by PT Manual: passive stretch and hold into ankle DF and eversion, hamstring stretch and knee extension Seated: long arc quad 2x10 each-8 step under feet  Self stretch using strap x 5 holding 10 sec each-seated with foot on 8 step Standing on edge of foam in walker, calf stretch x 10 holding 10 sec Seated:  Long arc quads: 2# added 2x10 Self stretch using strap x 5 holding 10 sec each-seated with foot on 8 step Hamstring curl with blue loop x15   PATIENT EDUCATION:  Education details: Initiated HEP Person educated: Patient and Parent Education method: Programmer, Multimedia, Facilities Manager, Verbal cues, and Handouts Education comprehension: verbalized understanding, returned demonstration, and verbal cues required  HOME EXERCISE PROGRAM: Access Code: [email or web address] URL: [email or web address] Date: 04/16/2024 Prepared by: Burnard  Exercises - Seated Ankle Alphabet  - 1 x daily - 7 x weekly - 1 sets - 1 reps - a-z hold - Standing Bilateral Gastroc Stretch with Step  - 1 x daily - 7 x weekly - 1 sets - 10 reps - 10 sec hold - Seated Ankle Inversion Eversion AROM  - 1 x daily - 7 x weekly - 1 sets - 10 reps - 5 sec each side hold - Long Sitting Calf Stretch with Strap  - 1 x daily - 7 x weekly - 1 sets - 10 reps - 10 sec hold - Supine Hamstring Stretch with Strap  - 2 x daily - 7 x weekly - 1 sets - 5 reps - 15 hold - Supine Short Arc Quad  - 2 x daily - 7 x weekly - 2 sets - 10 reps - 5 hold - Supine Active Straight Leg Raise  - 2 x daily - 7 x weekly - 1-2 sets - 5 reps  ASSESSMENT:  CLINICAL IMPRESSION: Patient is still progressing slowly with regard to ankle ROM and knee extension.  Pt ordered a strap for stretching and will use this at home for hamstring and ankle mobility.  Pt is able to achieve Rt knee P/ROM extension to 0 degrees and Lt to lacking 2 degrees with ease.  Ankle mobility is unchanged into DF.  PT  emphasized the importance of regular stretching and use of night splints regularly to improve DF.  She has not been consistently been doing this.  Measurements routed to MD today.  She would benefit from skilled PT to avoid contractures and muscle atrophy due to wb restrictions for a prolonged period of time.     OBJECTIVE IMPAIRMENTS: Abnormal gait, decreased balance, decreased mobility, difficulty walking, decreased ROM, decreased strength, increased fascial restrictions, increased muscle spasms, impaired flexibility, postural dysfunction, and pain.   ACTIVITY LIMITATIONS: carrying, lifting, bending, standing, squatting, stairs, transfers, bed mobility, bathing, toileting, dressing, and hygiene/grooming  PARTICIPATION LIMITATIONS: meal prep, cleaning, laundry, driving, shopping, and community activity  PERSONAL FACTORS: Past/current experiences and 1 comorbidity: achondroplasia are also affecting patient's functional outcome.   REHAB POTENTIAL: Good  CLINICAL DECISION MAKING: Stable/uncomplicated  EVALUATION COMPLEXITY: Low   GOALS: Goals reviewed with patient? Yes  SHORT TERM GOALS: Target date: 05/21/2024  Pain report to be no greater than 4/10  Baseline:no pain reported (04/23/24) Goal status: MET  2.  Patient will be independent with initial HEP  Baseline:  Goal status: MET(05/09/24)   LONG TERM GOALS: Target date: 07/02/2024  Patient to report pain no greater than 2/10  Baseline:  Goal status: INITIAL  2.  Patient to  be independent with advanced HEP  Baseline:  Goal status: INITIAL  3.  Increased left ankle ROM to [facility] Baseline:  see above (05/09/24) Goal status: INITIAL  4.  Increase left knee extension to 0 degrees Baseline:  Goal status: INITIAL  5.  Patient to be able to stand or walk for at least 15 min without leg pain  Baseline:  Goal status: INITIAL  6.  Patient to be able to ambulate 300 feet without assistive device with normal heel to toe  progresseion Baseline:  Goal status: INITIAL   PLAN:  PT FREQUENCY: 1-2x/week  PT DURATION: 12 weeks  PLANNED INTERVENTIONS: 97110-Therapeutic exercises, 97530- Therapeutic activity, 97112- Neuromuscular re-education, 97535- Self Care, 02859- Manual therapy, [phone]- Gait training, [phone]- Aquatic Therapy, [phone]- Electrical stimulation (unattended), [phone]- Electrical stimulation (manual), [id number]- Vasopneumatic device, [id number]- Ionotophoresis 4mg /ml Dexamethasone , 79439 (1-2 muscles), [phone] (3+ muscles)- Dry Needling, Patient/Family education, Balance training, Stair training, Taping, Joint mobilization, Scar mobilization, DME instructions, Wheelchair mobility training, Cryotherapy, and Moist heat  PLAN FOR NEXT SESSION: focus on ankle ROM, osteotomy protocol, patient is allowed 50 lbs of weight bearing, quad rehab.  Send measurements to the MD on 05/23/24 per his request   Burnard Joy, PT 05/09/24 12:27 PM  [facility] [address] [place], [place] [address] Phone # [phone] Fax [phone]  "

## 2024-05-09 NOTE — Telephone Encounter (Signed)
CLINICAL USE BELOW THIS LINE (use X to signify taken)  __X__Form received and placed in providers office for signature. ____Form completed and faxed to LOA Dept. ____Form completed & LVM to notify pt ready for pick up ____Charge sheet & copy of form in front office folder for office supervisor.   

## 2024-05-09 NOTE — Telephone Encounter (Signed)
 Patient dropped off document Handicap Placard, to be filled out by provider. Patient requested to send it back via Call Patient to pick up within 7-days. Document is located in providers tray at front office.Please advise at Mobile [phone] (mobile)

## 2024-05-10 NOTE — Telephone Encounter (Signed)
 Called patient and made that form has been completed and that she come by the office to pick up when she is able to. She thanked me for calling

## 2024-05-14 ENCOUNTER — Ambulatory Visit

## 2024-05-14 DIAGNOSIS — R252 Cramp and spasm: Secondary | ICD-10-CM

## 2024-05-14 DIAGNOSIS — M25562 Pain in left knee: Secondary | ICD-10-CM

## 2024-05-14 DIAGNOSIS — M6281 Muscle weakness (generalized): Secondary | ICD-10-CM

## 2024-05-14 DIAGNOSIS — R262 Difficulty in walking, not elsewhere classified: Secondary | ICD-10-CM

## 2024-05-14 DIAGNOSIS — M25572 Pain in left ankle and joints of left foot: Secondary | ICD-10-CM

## 2024-05-14 DIAGNOSIS — M25571 Pain in right ankle and joints of right foot: Secondary | ICD-10-CM

## 2024-05-14 DIAGNOSIS — R293 Abnormal posture: Secondary | ICD-10-CM

## 2024-05-14 DIAGNOSIS — M25561 Pain in right knee: Secondary | ICD-10-CM

## 2024-05-16 ENCOUNTER — Other Ambulatory Visit: Payer: Self-pay | Admitting: Orthopaedic Surgery

## 2024-05-16 ENCOUNTER — Ambulatory Visit

## 2024-05-16 ENCOUNTER — Ambulatory Visit
Admission: RE | Admit: 2024-05-16 | Discharge: 2024-05-16 | Disposition: A | Source: Ambulatory Visit | Attending: Orthopaedic Surgery | Admitting: Orthopaedic Surgery

## 2024-05-16 DIAGNOSIS — Z9889 Other specified postprocedural states: Secondary | ICD-10-CM

## 2024-05-16 DIAGNOSIS — M25562 Pain in left knee: Secondary | ICD-10-CM

## 2024-05-16 DIAGNOSIS — M25572 Pain in left ankle and joints of left foot: Secondary | ICD-10-CM

## 2024-05-16 DIAGNOSIS — M25561 Pain in right knee: Secondary | ICD-10-CM

## 2024-05-16 NOTE — Therapy (Signed)
 " OUTPATIENT PHYSICAL THERAPY TREATMENT   Patient Name: Andrea Harrison MRN: [id number] DOB:January 24, 1996, 29 y.o., female Today's Date: 05/16/2024  END OF SESSION:  PT End of Session - 05/16/24 1237     Visit Number 11    Date for Recertification  06/04/24    Authorization Type [employer]    Authorization - Visit Number 11    Authorization - Number of Visits 15    PT Start Time 1147    PT Stop Time 1228    PT Time Calculation (min) 41 min    Activity Tolerance Patient tolerated treatment well    Behavior During Therapy [facility] for tasks assessed/performed                 History reviewed. No pertinent past medical history. 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  Not sure he has the most recent disc.  They are considering just meeting with him after the next set of xrays since those are coming up.  From evaluation: Patient underwent tibal and femoral osteotomy during two separate surgeries.  First surgery was on 01/16/24 for right LE and second surgery was on left LE on 10/21.  She is allowed approx 75% weight bearing at this time.  She is able to transfer independently.  And use her walker for short distances.  She is accompanied by her mother today.  She arrives in wheelchair.  She denies any significant pain, rating this at 1/2 out of 10.  She still has several weeks of lengthening schedule and will not  likely be weight bearing until this schedule is complete and bone growth is completed.  However, MD would like for patient to participate in PT to avoid contractures and prepare for gait training.    PERTINENT HISTORY: Achondroplasia PAIN:  05/14/24 Are you having pain? Yes: NPRS scale: 1/10 Pain location: both legs Pain description: mild ache Aggravating factors: weight bearing Relieving factors: rest  PRECAUTIONS: None  RED FLAGS: None   WEIGHT BEARING RESTRICTIONS: Yes 50 lbs (about 75% wb)  FALLS:  Has patient fallen in last 6 months? No  LIVING ENVIRONMENT: Lives with: lives with their family Lives in: House/apartment  OCCUPATION: na  PLOF: Independent, Independent with basic ADLs, Independent with household mobility without device, Independent with community mobility without device, Independent with homemaking with ambulation, Independent with gait, and Independent with transfers  PATIENT GOALS: to resume walking  NEXT MD VISIT: every 2 weeks for xrays  OBJECTIVE:  Note: Objective measures were completed at Evaluation unless otherwise noted.  DIAGNOSTIC FINDINGS: none found for LE's  PATIENT SURVEYS:  LEFS  Extreme difficulty/unable (0), Quite a bit of difficulty (1), Moderate difficulty (2), Little difficulty (3), No difficulty (4) Survey date:  04/09/24  Score total:  37/80     COGNITION: Overall cognitive status: Within functional limits for tasks assessed  SENSATION: WFL  POSTURE: achondroplasia  PALPATION: na  LOWER EXTREMITY ROM:  Passive ROM Right eval Right 04/25/24 Right 05/09/24 Left eval Left  04/25/24 Left  05/09/24  Hip flexion        Hip extension        Hip abduction        Hip adduction        Hip internal rotation        Hip external rotation        Knee flexion        Knee extension  0 0 -4 -4 -2  Ankle dorsiflexion 4 4 10  (lacking) 10 8 10  (lacking)  Ankle plantarflexion    excess    Ankle inversion  full      Ankle  eversion 2  10  11 11    (Blank rows = not tested)  LOWER EXTREMITY MMT:   LOWER EXTREMITY SPECIAL TESTS:  na  FUNCTIONAL TESTS:  5 times sit to stand: will complete when patient is full WB Timed up and go (TUG): will complete when patient is full WB  TRANSFERS: Independent with all transfers using rolling walker.  May need assist with showering for safety.    GAIT: Distance walked: 5 feet Assistive device utilized: Walker - 2 wheeled Level of assistance: Complete Independence and SBA Comments: hop to gait                                                                                                                                TREATMENT DATE:  05/16/24 Reclined incline table:  Manual: Manual DF and eversion stretch bilateral ankles STM to bilateral gastroc/soleus Manual: passive hamstring stretch x 5 each side holding 20-30 sec each  Therex/theract: Supine SLR 2 x 10 each LE with 2 lbs Hook lying SAQ 2 x 10 each LE with 2 lbs Supine hip abduction 2 x 10 each LE- with pink loop band around thigh with PT anchoring (light resistance) Supine heel slides  x 10 each LE- using strap with overpressure Standing calf stretch on half roll x 10 holding 10 sec each Standing inversion/eversion stretch using half roll x 20 each LE Seated LAQ 2 x 10 with 2 lbs    05/14/24 Reclined incline table:  Manual: Scar massage to tibia incisions bilaterally Manual DF and eversion stretch bilateral ankles STM to bilateral gastroc/soleus Manual: passive hamstring stretch x 5 each side holding 20-30 sec each  Therex/theract: Supine SLR 2 x 10 each LE with 2 lbs Hook lying SAQ 2 x 10 each LE with 2 lbs Supine hip abduction 2 x 10 each LE Supine heel slides 2 x 10 each LE Standing calf stretch on half roll x 10 holding 10 sec each Standing inversion/eversion stretch using half roll x 20 each LE Seated LAQ 2 x 10 with 2 lbs   05/09/24 Reclined on wedge:  Supine SLR 2 x 10 each LE Supine  hamstring stretch:  5x15 seconds-passive by PT Manual: passive stretch and hold into ankle DF and eversion, hamstring stretch and knee extension Seated: long arc quad 2x10 each-8 step under feet 2# added  Self stretch using strap x 5 holding 10 sec each-seated with foot on 8 step SLR 2 x 10 0# on each LE  Seated:  Long arc quads: 2# added 2x10 Self stretch using strap x 5 holding 10 sec each-seated with foot on 8 step   05/07/24 Reclined on wedge:  Short arc quads 2# added 2x10 with leg over foam roll  Supine SLR 2 x 10 each LE Manual: passive stretch and hold into ankle DF and eversion, hamstring stretch and knee extension with gentle overpressure Seated: long arc quad 2x10 each-8 step under feet  Self stretch using strap x 5 holding 10 sec each-seated with foot on 8 step SLR 2 x 10 0# on each LE Supine hip abduction 2 x 10 with towel under heel  Seated:  Long arc quads: 2# added 2x10 Self stretch using strap x 5 holding 10 sec each-seated with foot on 8 step Hamstring curl with blue loop x15  PATIENT EDUCATION:  Education details: Initiated HEP Person educated: Patient and Parent Education method: Programmer, Multimedia, Facilities Manager, Verbal cues, and Handouts Education comprehension: verbalized understanding, returned demonstration, and verbal cues required  HOME EXERCISE PROGRAM: Access Code: [email or web address] URL: [email or web address] Date: 04/16/2024 Prepared by: Burnard  Exercises - Seated Ankle Alphabet  - 1 x daily - 7 x weekly - 1 sets - 1 reps - a-z hold - Standing Bilateral Gastroc Stretch with Step  - 1 x daily - 7 x weekly - 1 sets - 10 reps - 10 sec hold - Seated Ankle Inversion Eversion AROM  - 1 x daily - 7 x weekly - 1 sets - 10 reps - 5 sec each side hold - Long Sitting Calf Stretch with Strap  - 1 x daily - 7 x weekly - 1 sets - 10 reps - 10 sec hold - Supine Hamstring Stretch with Strap  - 2 x daily - 7 x weekly - 1 sets - 5 reps - 15 hold - Supine Short  Arc Quad  - 2 x daily - 7 x weekly - 2 sets - 10 reps - 5 hold - Supine Active Straight Leg Raise  - 2 x daily - 7 x weekly - 1-2 sets - 5 reps  ASSESSMENT:  CLINICAL IMPRESSION: Patient continues to progress slowly.  She started wearing her night splints and is continuing to do her standing stretches working into neutral but has not been doing any stretching past neutral.  Her stretch out strap arrived and she is using this to strength, hamstrings and knee.  Pt tolerated addition of light resistance loop for supine hip abduction. Pt with improved ease of passive ROM today.   She would benefit from continuing skilled PT to progress toward goals stated below.     OBJECTIVE IMPAIRMENTS: Abnormal gait, decreased balance, decreased mobility, difficulty walking, decreased ROM, decreased strength, increased fascial restrictions, increased muscle spasms, impaired flexibility, postural dysfunction, and pain.   ACTIVITY LIMITATIONS: carrying, lifting, bending, standing, squatting, stairs, transfers, bed mobility, bathing, toileting, dressing, and hygiene/grooming  PARTICIPATION LIMITATIONS: meal prep, cleaning, laundry, driving, shopping, and community activity  PERSONAL FACTORS: Past/current experiences and 1 comorbidity: achondroplasia are also affecting patient's functional outcome.   REHAB POTENTIAL: Good  CLINICAL DECISION MAKING: Stable/uncomplicated  EVALUATION COMPLEXITY: Low   GOALS: Goals reviewed with patient? Yes  SHORT TERM  GOALS: Target date: 05/21/2024  Pain report to be no greater than 4/10  Baseline:no pain reported (04/23/24) Goal status: MET  2.  Patient will be independent with initial HEP  Baseline:  Goal status: MET(05/09/24)   LONG TERM GOALS: Target date: 07/02/2024  Patient to report pain no greater than 2/10  Baseline:  Goal status: INITIAL  2.  Patient to be independent with advanced HEP  Baseline:  Goal status: INITIAL  3.  Increased left ankle ROM to  [facility] Baseline:  see above (05/09/24) Goal status: INITIAL  4.  Increase left knee extension to 0 degrees Baseline:  Goal status: INITIAL  5.  Patient to be able to stand or walk for at least 15 min without leg pain  Baseline:  Goal status: INITIAL  6.  Patient to be able to ambulate 300 feet without assistive device with normal heel to toe progresseion Baseline:  Goal status: INITIAL   PLAN:  PT FREQUENCY: 1-2x/week  PT DURATION: 12 weeks  PLANNED INTERVENTIONS: 97110-Therapeutic exercises, 97530- Therapeutic activity, 97112- Neuromuscular re-education, 97535- Self Care, 02859- Manual therapy, [phone]- Gait training, [phone]- Aquatic Therapy, [phone]- Electrical stimulation (unattended), [phone]- Electrical stimulation (manual), [id number]- Vasopneumatic device, [id number]- Ionotophoresis 4mg /ml Dexamethasone , 79439 (1-2 muscles), 20561 (3+ muscles)- Dry Needling, Patient/Family education, Balance training, Stair training, Taping, Joint mobilization, Scar mobilization, DME instructions, Wheelchair mobility training, Cryotherapy, and Moist heat  PLAN FOR NEXT SESSION: focus on ankle ROM, osteotomy protocol, patient is allowed 50 lbs of weight bearing, quad rehab.  Send measurements to the MD on 05/23/24 per his request   Burnard Joy, PT 05/16/24 12:38 PM  [facility] [address] [place], [place] [address] Phone # [phone] Fax [phone]  "

## 2024-05-21 ENCOUNTER — Ambulatory Visit

## 2024-05-23 ENCOUNTER — Ambulatory Visit

## 2024-05-28 ENCOUNTER — Ambulatory Visit

## 2024-05-30 ENCOUNTER — Ambulatory Visit

## 2024-06-04 ENCOUNTER — Ambulatory Visit

## 2024-06-06 ENCOUNTER — Ambulatory Visit

## 2024-06-11 ENCOUNTER — Ambulatory Visit

## 2024-06-13 ENCOUNTER — Ambulatory Visit

## 2024-06-18 ENCOUNTER — Ambulatory Visit

## 2024-06-20 ENCOUNTER — Ambulatory Visit

## 2024-06-25 ENCOUNTER — Ambulatory Visit

## 2024-06-27 ENCOUNTER — Ambulatory Visit

## 2024-07-02 ENCOUNTER — Ambulatory Visit
